# Patient Record
Sex: Male | Born: 1960
Health system: Southern US, Community
[De-identification: ages and names within clinical notes are randomized; demographics above are authoritative.]

## PROBLEM LIST (undated history)

## (undated) DIAGNOSIS — I10 Essential (primary) hypertension: Secondary | ICD-10-CM

## (undated) DIAGNOSIS — K219 Gastro-esophageal reflux disease without esophagitis: Secondary | ICD-10-CM

## (undated) DIAGNOSIS — N2889 Other specified disorders of kidney and ureter: Secondary | ICD-10-CM

## (undated) DIAGNOSIS — F419 Anxiety disorder, unspecified: Secondary | ICD-10-CM

## (undated) DIAGNOSIS — F32A Depression, unspecified: Secondary | ICD-10-CM

## (undated) DIAGNOSIS — E119 Type 2 diabetes mellitus without complications: Secondary | ICD-10-CM

## (undated) DIAGNOSIS — R768 Other specified abnormal immunological findings in serum: Secondary | ICD-10-CM

## (undated) DIAGNOSIS — M542 Cervicalgia: Secondary | ICD-10-CM

## (undated) DIAGNOSIS — E1142 Type 2 diabetes mellitus with diabetic polyneuropathy: Secondary | ICD-10-CM

## (undated) DIAGNOSIS — L409 Psoriasis, unspecified: Secondary | ICD-10-CM

## (undated) DIAGNOSIS — M1990 Unspecified osteoarthritis, unspecified site: Secondary | ICD-10-CM

## (undated) DIAGNOSIS — E785 Hyperlipidemia, unspecified: Secondary | ICD-10-CM

## (undated) DIAGNOSIS — K760 Fatty (change of) liver, not elsewhere classified: Secondary | ICD-10-CM

## (undated) HISTORY — DX: Gastro-esophageal reflux disease without esophagitis: K21.9

## (undated) HISTORY — DX: Cervicalgia: M54.2

## (undated) HISTORY — PX: EXTERNAL EAR SURGERY: SHX627

## (undated) HISTORY — DX: Type 2 diabetes mellitus without complications: E11.9

## (undated) HISTORY — PX: OTHER SURGICAL HISTORY: SHX169

## (undated) HISTORY — PX: BACK SURGERY: SHX140

---

## 2005-11-20 ENCOUNTER — Ambulatory Visit: Payer: Self-pay | Admitting: Gastroenterology

## 2005-12-13 ENCOUNTER — Ambulatory Visit (HOSPITAL_COMMUNITY): Admission: RE | Admit: 2005-12-13 | Discharge: 2005-12-13 | Payer: Self-pay | Admitting: Gastroenterology

## 2012-05-18 ENCOUNTER — Emergency Department (HOSPITAL_COMMUNITY)
Admission: EM | Admit: 2012-05-18 | Discharge: 2012-05-18 | Disposition: A | Discharge status: Home / Self Care | Payer: Self-pay | Attending: Emergency Medicine | Admitting: Emergency Medicine

## 2012-05-18 DIAGNOSIS — S0190XA Unspecified open wound of unspecified part of head, initial encounter: Secondary | ICD-10-CM | POA: Insufficient documentation

## 2012-05-18 DIAGNOSIS — Y9229 Other specified public building as the place of occurrence of the external cause: Secondary | ICD-10-CM | POA: Insufficient documentation

## 2012-05-18 DIAGNOSIS — IMO0002 Reserved for concepts with insufficient information to code with codable children: Secondary | ICD-10-CM | POA: Insufficient documentation

## 2012-05-18 DIAGNOSIS — Z23 Encounter for immunization: Secondary | ICD-10-CM | POA: Insufficient documentation

## 2012-05-18 DIAGNOSIS — Y939 Activity, unspecified: Secondary | ICD-10-CM | POA: Insufficient documentation

## 2016-04-13 ENCOUNTER — Other Ambulatory Visit (HOSPITAL_BASED_OUTPATIENT_CLINIC_OR_DEPARTMENT_OTHER): Payer: Self-pay | Admitting: Physician Assistant

## 2016-04-13 DIAGNOSIS — R945 Abnormal results of liver function studies: Principal | ICD-10-CM

## 2016-04-13 DIAGNOSIS — R7989 Other specified abnormal findings of blood chemistry: Secondary | ICD-10-CM

## 2016-04-21 ENCOUNTER — Ambulatory Visit (HOSPITAL_BASED_OUTPATIENT_CLINIC_OR_DEPARTMENT_OTHER)
Admission: RE | Admit: 2016-04-21 | Discharge: 2016-04-21 | Disposition: A | Discharge status: Home / Self Care | Payer: Medicaid Other | Source: Ambulatory Visit | Attending: Physician Assistant | Admitting: Physician Assistant

## 2016-04-21 DIAGNOSIS — K760 Fatty (change of) liver, not elsewhere classified: Secondary | ICD-10-CM | POA: Diagnosis not present

## 2016-04-21 DIAGNOSIS — R161 Splenomegaly, not elsewhere classified: Secondary | ICD-10-CM | POA: Insufficient documentation

## 2016-04-21 DIAGNOSIS — R7989 Other specified abnormal findings of blood chemistry: Secondary | ICD-10-CM

## 2016-04-21 DIAGNOSIS — R945 Abnormal results of liver function studies: Secondary | ICD-10-CM

## 2016-10-24 ENCOUNTER — Encounter: Payer: Self-pay | Admitting: Internal Medicine

## 2016-10-24 ENCOUNTER — Ambulatory Visit (INDEPENDENT_AMBULATORY_CARE_PROVIDER_SITE_OTHER): Payer: Medicaid Other | Admitting: Internal Medicine

## 2016-10-24 ENCOUNTER — Telehealth: Payer: Self-pay | Admitting: *Deleted

## 2016-10-24 DIAGNOSIS — B182 Chronic viral hepatitis C: Secondary | ICD-10-CM

## 2016-10-24 MED ORDER — GLECAPREVIR-PIBRENTASVIR 100-40 MG PO TABS: 3.0000 | ORAL_TABLET | Freq: Every day | ORAL | 1 refills | Status: DC

## 2016-10-24 NOTE — Patient Instructions (Signed)
Date 10/24/16  Dear Mr Gullickson, As discussed in the ID Clinic, your hepatitis C therapy will include the following medications:          Mavyret (glecaprevir 100 mg/pibrentasvir 40 mg): Take 3 tablets by mouth once daily for 8 or 12 weeks ---------------------------------------------------------------- Your HCV Treatment Start Date: TBA   Your HCV genotype:  1a    Liver Fibrosis: TBD    ---------------------------------------------------------------- YOUR PHARMACY CONTACT:   [facility] [address] [place], [place] [address] Phone: [phone] Hours: Monday to Friday 7:30 am to 6:00 pm   Please always contact your pharmacy at least 3-4 business days before you run out of medications to ensure your next month's medication is ready or 1 week prior to running out if you receive it by mail.  Remember, each prescription is for 28 days. ---------------------------------------------------------------- GENERAL NOTES REGARDING YOUR HEPATITIS C MEDICATION:  Mavyret: - tablets are pink, oblong shape - take 3 tablets daily with food. - The tablets should be stored at room temperature.  - Acid reducing agents such as H2 blockers (ie. Pepcid (famotidine), Zantac (ranitidine), Tagamet (cimetidine), Axid (nizatidine) and proton pump inhibitors (ie. Prilosec (omeprazole), Protonix (pantoprazole), Nexium (esomeprazole), or Aciphex (rabeprazole)) can decrease effectiveness of Harvoni. Do not take until you have discussed with a health care provider.    -Antacids that contain magnesium and/or aluminum hydroxide (ie. Milk of Magensia, Rolaids, Gaviscon, Maalox, Mylanta, an dArthritis Pain Formula) can reduce absorption of Harvoni, so take them at least 4 hours before or after Harvoni.  -Calcium carbonate (calcium supplements or antacids such as Tums, Caltrate, Os-Cal) needs to be taken at least 4 hours hours before or after Harvoni.  -St. John's wort or any products that contain  St. John's wort like some herbal supplements  Please inform the office prior to starting any of these medications.  - The common side effects associated with Mavyret include:      1. Fatigue      2. Headache      3. Nausea      4. Diarrhea      5. Insomnia  Please note that this only lists the most common side effects and is NOT a comprehensive list of the potential side effects of these medications. For more information, please review the drug information sheets that come with your medication package from the pharmacy.  ---------------------------------------------------------------- GENERAL HELPFUL HINTS ON HCV THERAPY: 1. Stay well-hydrated. 2. Notify the ID Clinic of any changes in your other over-the-counter/herbal or prescription medications. 3. If you miss a dose of your medication, take the missed dose as soon as you remember. Return to your regular time/dose schedule the next day.  4.  Do not stop taking your medications without first talking with your healthcare provider. 5.  You may take Tylenol (acetaminophen), as long as the dose is less than 2000 mg (OR no more than 4 tablets of the Tylenol Extra Strengths  tablet) in 24 hours. 6.  You will see our pharmacist-specialist within the first 2 weeks of starting your medication to monitor for any possible side effects. 7.  You will have labs once during treatment, after soon after treatment completion and one final lab 6 months after treatment completion to verify the virus is out of your system.  Staci Righter, MD  [facility] [address] [place] [address] [place], [place]  [address] [phone]

## 2016-10-24 NOTE — Progress Notes (Signed)
[facility]   CC: consideration for treatment for chronic hepatitis C  HPI:  +Frank Randolph is a 56 y.o. male who presents for initial evaluation and management of chronic hepatitis C.  Patient tested positive over 10 years ago. Hepatitis C-associated risk factors present are: tattoos. Patient denies history of blood transfusion, sexual contact with person with liver disease. Patient has had other studies performed. Results: hepatitis C RNA by PCR, result: positive. Patient has not had prior treatment for Hepatitis C. Patient does not have a past history of liver disease. Patient does not have a family history of liver disease. Patient does not  have associated signs or symptoms related to liver disease.  Labs reviewed and confirm chronic hepatitis C with a positive viral load.  He previously had a biopsy by his report but told he did not require treatment at the time.   Labs reviewed and genoytpe 1a.      Patient does have documented immunity to Hepatitis A. Patient does have documented immunity to Hepatitis B.    Review of Systems:   Constitutional: negative for fatigue, malaise and anorexia Gastrointestinal: negative for diarrhea Integument/breast: negative for rash All other systems reviewed and are negative       No past medical history on file.  Prior to Admission medications   Medication Sig Start Date End Date Taking? Authorizing Provider  ACCU-CHEK AVIVA PLUS test strip USE TO TEST BLOOD SUGAR THREE TIMES DAILY AS DIRECTED 10/01/16  Yes Historical Provider, MD  glimepiride (AMARYL) 4 MG tablet Take 4 mg by mouth 2 (two) times daily. 10/01/16  Yes Historical Provider, MD  GLOBAL INSULIN SYRINGES 30G X 5/16" 0.3 ML MISC USE TO INJECT INSULIN TWICE DAILY (50 DAY SUPPLY) DO NOT SWITCH PATIENT WANTS THIS SIZE ONLY 10/01/16  Yes Historical Provider, MD  HUMULIN 70/30 (70-30) 100 UNIT/ML injection INJECT 20 UNITS SUB-Q TWICE DAILY BEFORE MEALS 10/01/16  Yes  Historical Provider, MD  metFORMIN (GLUCOPHAGE-XR) 500 MG 24 hr tablet Take 1,000 mg by mouth 2 (two) times daily. 10/01/16  Yes Historical Provider, MD  triamcinolone cream (KENALOG) 0.1 % APPLY TO AFFECTED AREA TWICE DAILY AS NEEDED 08/03/16  Yes Historical Provider, MD  Glecaprevir-Pibrentasvir (MAVYRET) 100-40 MG TABS Take 3 tablets by mouth daily. 10/24/16   Gardiner Barefoot, MD    No Known Allergies  Social History  Substance Use Topics  . Smoking status: Former Smoker    Types: Cigarettes  . Smokeless tobacco: Never Used  . Alcohol use 3.6 oz/week    6 Cans of beer per week     Comment: weekends    FMH: no cirrhosis, no liver cancer   Objective:  Constitutional: in no apparent distress and alert,  Vitals:   10/24/16 0925  BP: (!) 169/103  Pulse: (!) 101  Temp: 97.8 F (36.6 C)   Eyes: anicteric Cardiovascular: Cor RRR Respiratory: CTA B;' normal respiratory effort Gastrointestinal: Bowel sounds are normal, liver is not enlarged, soft, nt Musculoskeletal: no pedal edema noted Skin: negatives: no rash; no porphyria cutanea tarda Lymphatic: no cervical lymphadenopathy   Laboratory  Labs and history reviewed and show CHILD-PUGH A  5-6 points: Child class A 7-9 points: Child class B 10-15 points: Child class C   Assessment: New Patient with Chronic Hepatitis C genotype 1a, untreated.  I discussed with the patient the lab findings that confirm chronic hepatitis C as well as the natural history and progression of disease including about 30% of  people who develop cirrhosis of the liver if left untreated and once cirrhosis is established there is a 2-7% risk per year of liver cancer and liver failure.  I discussed the importance of treatment and benefits in reducing the risk, even if significant liver fibrosis exists.   Plan: 1) Patient counseled extensively on limiting acetaminophen to no more than 2 grams daily, avoidance of alcohol. 2) Transmission discussed with  patient including sexual transmission, sharing razors and toothbrush.   3) Will need referral to gastroenterology if concern for cirrhosis 4) Will need referral for substance abuse counseling: No.; Further work up to include urine drug screen  No. 5) Will prescribe Mayvret for 8 weeks 12 weeks, depending on Fibrosure  6) Hepatitis A vaccine No. 7) Hepatitis B vaccine No. 8) Pneumovax vaccine  9) Further work up to include liver staging with elastography 10) will follow up after starting medication 11) readiness form signed

## 2016-10-24 NOTE — Telephone Encounter (Signed)
Prior authorization initiated for ultrasound elastography, ref # [id number]. It is now in review and will be notified via fax in 1-2 business days. Patient aware

## 2016-10-27 LAB — LIVER FIBROSIS, FIBROTEST-ACTITEST
ALPHA-2-MACROGLOBULIN: 537 mg/dL — AB (ref 106–279)
ALT: 239 U/L — AB (ref 9–46)
APOLIPOPROTEIN A1: 122 mg/dL (ref 94–176)
BILIRUBIN: 0.9 mg/dL (ref 0.2–1.2)
FIBROSIS SCORE: 0.96
GGT: 147 U/L — AB (ref 3–85)
Haptoglobin: 32 mg/dL — ABNORMAL LOW (ref 43–212)
Necroinflammat ACT Score: 0.94
Reference ID: 1902687

## 2016-10-29 NOTE — Telephone Encounter (Signed)
Medicaid has denied the ultrasound elastography. They will only approve an abdominal ultrasound limited (CPT [phone]) at this time. Dr. Luciana Axe is aware and patient has been notified.

## 2016-10-31 ENCOUNTER — Other Ambulatory Visit: Payer: Self-pay | Admitting: Pharmacist

## 2016-10-31 DIAGNOSIS — B182 Chronic viral hepatitis C: Secondary | ICD-10-CM

## 2016-10-31 MED ORDER — GLECAPREVIR-PIBRENTASVIR 100-40 MG PO TABS: 3.0000 | ORAL_TABLET | Freq: Every day | ORAL | 2 refills | Status: DC

## 2016-11-02 MED FILL — MAVYRET 100-40 MG TABS: 100-40 | 28 days supply | Qty: 84 | Fill #0

## 2016-11-05 ENCOUNTER — Other Ambulatory Visit: Payer: Self-pay | Admitting: Internal Medicine

## 2016-11-05 DIAGNOSIS — B182 Chronic viral hepatitis C: Secondary | ICD-10-CM

## 2016-11-06 ENCOUNTER — Telehealth: Payer: Self-pay | Admitting: *Deleted

## 2016-11-06 NOTE — Telephone Encounter (Signed)
Patient notified that his Hep C medication is ready for pick up at [facility] outpatient pharmacy. He can also have it delivered if he prefers. Patient said he would pick it up tomorrow.

## 2016-11-16 ENCOUNTER — Encounter: Payer: Self-pay | Admitting: Pharmacy Technician

## 2016-12-12 MED FILL — MAVYRET 100-40 MG TABS: 100-40 | 28 days supply | Qty: 84 | Fill #1

## 2016-12-20 ENCOUNTER — Ambulatory Visit (INDEPENDENT_AMBULATORY_CARE_PROVIDER_SITE_OTHER): Payer: Medicaid Other | Admitting: Pharmacist

## 2016-12-20 DIAGNOSIS — B182 Chronic viral hepatitis C: Secondary | ICD-10-CM | POA: Diagnosis not present

## 2016-12-20 NOTE — Progress Notes (Signed)
HPI: Frank Randolph is a 56 y.o. male who presents to the [facility] pharmacy clinic today for Hep C follow-up. He has genotype 1a, F4 fibrosis, and started 12 weeks of Mavyret ~11/09/16.   No results found for: HCVGENOTYPE, HEPCGENOTYPE  Allergies: No Known Allergies  Past Medical History: No past medical history on file.  Social History: Social History   Social History  . Marital status: Single    Spouse name: N/A  . Number of children: N/A  . Years of education: N/A   Social History Main Topics  . Smoking status: Former Smoker    Types: Cigarettes  . Smokeless tobacco: Never Used  . Alcohol use 3.6 oz/week    6 Cans of beer per week     Comment: weekends  . Drug use: No  . Sexual activity: Yes    Partners: Female   Other Topics Concern  . Not on file   Social History Narrative  . No narrative on file    Labs: No results found for: HIV1RNAQUANT, HIV1RNAVL, CD4TABS, HEPBSAB, HEPBSAG, HCVAB  No results found for: HCVGENOTYPE, HEPCGENOTYPE  No flowsheet data found.  ALT (U/L)  Date Value  10/24/2016 239 (H)    CrCl: CrCl cannot be calculated (No order found.).  Fibrosis Score: F4 as assessed by fibrosure   Child-Pugh Score: A  Previous Treatment Regimen: None  Assessment: Frank Randolph is here today for Hep C follow-up.  [facility] and is on his 2nd month of medications. He is having diarrhea with the medication and states he has had it since the beginning.  He has not tried anything for it.  Gave him recommendations for generic Imodium OTC.  Told him to keep hydrated.  He also complains of a slight headache since starting the medication. He missed 2 days of his Mavyret when he ran out of it the first month and did not call for a refill.  We finally were able to reach him the day after he took his last pill the first month, so that is why he missed 2 doses.  I told him not to miss anymore doses from here on out. He takes all three pills together in the morning  with breakfast. We will get a Hep C VL today. I will make his EOT appt with Dr. Luciana Axeomer.    Plans: - Continue 12 weeks of Mavyret - F/u with Dr. Luciana Axeomer for EOT since he is F4 on 8/1 at 2pm  Frank Randolph, PharmD, CPP Infectious Diseases Clinical Pharmacist [facility] 12/20/2016, 1:39 PM

## 2016-12-20 NOTE — Patient Instructions (Signed)
You can pick up some generic Imodium for your diarrhea. Take 2 tablets right now, then 1 tablet after each loose stool. Take no more than 7 tablets/day.

## 2016-12-24 LAB — HEPATITIS C RNA QUANTITATIVE
HCV QUANT: NOT DETECTED [IU]/mL
HCV Quantitative Log: <1.18 Log IU/mL

## 2016-12-28 ENCOUNTER — Telehealth: Payer: Self-pay | Admitting: *Deleted

## 2016-12-28 NOTE — Telephone Encounter (Signed)
Patient called for results of his hep c viral load. Advised patient that it is currently undetected. He asked what that meant and I said that it does not show hepatitis c in his blood. He said should he continue to take the medication and I advised that he should continue to take it as directed. Wendall MolaJacqueline Cockerham

## 2016-12-31 ENCOUNTER — Other Ambulatory Visit: Payer: Self-pay | Admitting: Pharmacist

## 2016-12-31 DIAGNOSIS — B182 Chronic viral hepatitis C: Secondary | ICD-10-CM

## 2016-12-31 MED ORDER — GLECAPREVIR-PIBRENTASVIR 100-40 MG PO TABS: 3.0000 | ORAL_TABLET | Freq: Every day | ORAL | 0 refills | Status: DC

## 2017-01-03 MED FILL — MAVYRET 100-40 MG TABS: 100-40 | 28 days supply | Qty: 84 | Fill #0

## 2017-02-13 ENCOUNTER — Encounter: Payer: Self-pay | Admitting: Internal Medicine

## 2017-02-13 ENCOUNTER — Ambulatory Visit (INDEPENDENT_AMBULATORY_CARE_PROVIDER_SITE_OTHER): Payer: Medicaid Other | Admitting: Internal Medicine

## 2017-02-13 VITALS — BP 164/100 | HR 92 | Temp 97.4°F | Ht 67.0 in | Wt 178.0 lb

## 2017-02-13 DIAGNOSIS — B182 Chronic viral hepatitis C: Secondary | ICD-10-CM | POA: Diagnosis not present

## 2017-02-13 DIAGNOSIS — K7400 Hepatic fibrosis, unspecified: Secondary | ICD-10-CM

## 2017-02-13 DIAGNOSIS — K7460 Unspecified cirrhosis of liver: Secondary | ICD-10-CM | POA: Insufficient documentation

## 2017-02-13 NOTE — Assessment & Plan Note (Signed)
I will check EOT lab today.  rtc 3-4 months for SVR 12

## 2017-02-13 NOTE — Assessment & Plan Note (Signed)
I will resubmit for the elastography to confirm above as this would indicated wether HCC screening and EGD is indicated

## 2017-02-13 NOTE — Progress Notes (Signed)
   Subjective:    Patient ID: Frank ArnoldEarl Randolph, male    DOB: June 07, 1961, 56 y.o.   MRN: [id number]  HPI Here for follow up of HCV He completed 12 weeks of Mavyret and here for EOT lab.  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  He had no issues during treatment.  No associated headache, fatigue.  No rashes.     Review of Systems  Constitutional: Negative for fatigue.  Gastrointestinal: Negative for diarrhea.  Skin: Negative for rash.       Objective:   Physical Exam  Constitutional: He appears well-developed and well-nourished. No distress.  Eyes: No scleral icterus.  Cardiovascular: Normal rate, regular rhythm and normal heart sounds.   No murmur heard. Lymphadenopathy:    He has no cervical adenopathy.  Skin: No rash noted.   SH: occasional alcohol       Assessment & Plan:

## 2017-02-14 LAB — COMPLETE METABOLIC PANEL WITH GFR
ALT: 103 U/L — AB (ref 9–46)
AST: 55 U/L — AB (ref 10–35)
Albumin: 4.6 g/dL (ref 3.6–5.1)
Alkaline Phosphatase: 58 U/L (ref 40–115)
BUN: 8 mg/dL (ref 7–25)
CALCIUM: 9.4 mg/dL (ref 8.6–10.3)
CHLORIDE: 102 mmol/L (ref 98–110)
CO2: 21 mmol/L (ref 20–31)
CREATININE: 0.67 mg/dL — AB (ref 0.70–1.33)
GFR, Est African American: >89 mL/min (ref >=60)
GFR, Est Non African American: >89 mL/min (ref >=60)
GLUCOSE: 77 mg/dL (ref 65–99)
Potassium: 4.1 mmol/L (ref 3.5–5.3)
Sodium: 139 mmol/L (ref 135–146)
Total Bilirubin: 1 mg/dL (ref 0.2–1.2)
Total Protein: 7.6 g/dL (ref 6.1–8.1)

## 2017-02-16 LAB — HEPATITIS C RNA QUANTITATIVE
HCV QUANT: NOT DETECTED [IU]/mL
HCV Quantitative Log: <1.18 Log IU/mL

## 2017-06-13 ENCOUNTER — Ambulatory Visit: Payer: Medicaid Other | Admitting: Internal Medicine

## 2017-06-13 ENCOUNTER — Emergency Department (HOSPITAL_BASED_OUTPATIENT_CLINIC_OR_DEPARTMENT_OTHER): Payer: Medicaid Other

## 2017-06-13 ENCOUNTER — Other Ambulatory Visit: Payer: Self-pay

## 2017-06-13 ENCOUNTER — Encounter: Payer: Self-pay | Admitting: Internal Medicine

## 2017-06-13 ENCOUNTER — Encounter (HOSPITAL_BASED_OUTPATIENT_CLINIC_OR_DEPARTMENT_OTHER): Payer: Self-pay | Admitting: *Deleted

## 2017-06-13 ENCOUNTER — Emergency Department (HOSPITAL_BASED_OUTPATIENT_CLINIC_OR_DEPARTMENT_OTHER)
Admission: EM | Admit: 2017-06-13 | Discharge: 2017-06-13 | Disposition: A | Discharge status: Home / Self Care | Payer: Medicaid Other | Attending: Emergency Medicine | Admitting: Emergency Medicine

## 2017-06-13 VITALS — BP 198/116 | HR 90 | Temp 97.7°F | Wt 182.0 lb

## 2017-06-13 DIAGNOSIS — Z7984 Long term (current) use of oral hypoglycemic drugs: Secondary | ICD-10-CM | POA: Diagnosis not present

## 2017-06-13 DIAGNOSIS — Z87891 Personal history of nicotine dependence: Secondary | ICD-10-CM | POA: Diagnosis not present

## 2017-06-13 DIAGNOSIS — K7400 Hepatic fibrosis, unspecified: Secondary | ICD-10-CM

## 2017-06-13 DIAGNOSIS — I10 Essential (primary) hypertension: Secondary | ICD-10-CM

## 2017-06-13 DIAGNOSIS — B182 Chronic viral hepatitis C: Secondary | ICD-10-CM | POA: Diagnosis present

## 2017-06-13 DIAGNOSIS — R51 Headache: Secondary | ICD-10-CM | POA: Diagnosis present

## 2017-06-13 DIAGNOSIS — Z79899 Other long term (current) drug therapy: Secondary | ICD-10-CM | POA: Insufficient documentation

## 2017-06-13 DIAGNOSIS — J0130 Acute sphenoidal sinusitis, unspecified: Secondary | ICD-10-CM | POA: Insufficient documentation

## 2017-06-13 HISTORY — DX: Essential (primary) hypertension: I10

## 2017-06-13 LAB — COMPLETE METABOLIC PANEL WITH GFR
AG RATIO: 1.7 (calc) (ref 1.0–2.5)
ALBUMIN MSPROF: 4.7 g/dL (ref 3.6–5.1)
ALT: 81 U/L — ABNORMAL HIGH (ref 9–46)
AST: 54 U/L — ABNORMAL HIGH (ref 10–35)
Alkaline phosphatase (APISO): 49 U/L (ref 40–115)
BILIRUBIN TOTAL: 0.8 mg/dL (ref 0.2–1.2)
BUN: 14 mg/dL (ref 7–25)
CHLORIDE: 104 mmol/L (ref 98–110)
CO2: 26 mmol/L (ref 20–32)
Calcium: 9.7 mg/dL (ref 8.6–10.3)
Creat: 0.76 mg/dL (ref 0.70–1.33)
GFR, EST AFRICAN AMERICAN: 119 mL/min/{1.73_m2} (ref >=60)
GFR, Est Non African American: 103 mL/min/{1.73_m2} (ref >=60)
Globulin: 2.7 g/dL (calc) (ref 1.9–3.7)
Glucose, Bld: 106 mg/dL — ABNORMAL HIGH (ref 65–99)
POTASSIUM: 4.4 mmol/L (ref 3.5–5.3)
Sodium: 140 mmol/L (ref 135–146)
TOTAL PROTEIN: 7.4 g/dL (ref 6.1–8.1)

## 2017-06-13 LAB — BASIC METABOLIC PANEL
Anion gap: 7 (ref 5–15)
BUN: 15 mg/dL (ref 6–20)
CALCIUM: 9.3 mg/dL (ref 8.9–10.3)
CHLORIDE: 105 mmol/L (ref 101–111)
CO2: 25 mmol/L (ref 22–32)
CREATININE: 0.75 mg/dL (ref 0.61–1.24)
Glucose, Bld: 120 mg/dL — ABNORMAL HIGH (ref 65–99)
Potassium: 3.8 mmol/L (ref 3.5–5.1)
SODIUM: 137 mmol/L (ref 135–145)

## 2017-06-13 LAB — CBC WITH DIFFERENTIAL/PLATELET
BASOS ABS: 88 {cells}/uL (ref 0–200)
BASOS PCT: 1 %
Basophils Absolute: 0.1 10*3/uL (ref 0.0–0.1)
Basophils Relative: 1.1 %
EOS ABS: 0.5 10*3/uL (ref 0.0–0.7)
EOS PCT: 6 %
EOS PCT: 6 %
Eosinophils Absolute: 480 cells/uL (ref 15–500)
HCT: 41.1 % (ref 39.0–52.0)
HEMATOCRIT: 43.3 % (ref 38.5–50.0)
HEMOGLOBIN: 14.6 g/dL (ref 13.0–17.0)
Hemoglobin: 15.3 g/dL (ref 13.2–17.1)
Lymphocytes Relative: 46 %
Lymphs Abs: 3088 cells/uL (ref 850–3900)
Lymphs Abs: 4.4 10*3/uL — ABNORMAL HIGH (ref 0.7–4.0)
MCH: 30.3 pg (ref 27.0–33.0)
MCH: 30.5 pg (ref 26.0–34.0)
MCHC: 35.3 g/dL (ref 32.0–36.0)
MCHC: 35.5 g/dL (ref 30.0–36.0)
MCV: 85.7 fL (ref 80.0–100.0)
MCV: 85.8 fL (ref 78.0–100.0)
MONOS PCT: 7.4 %
MONOS PCT: 8 %
MPV: 10.2 fL (ref 7.5–12.5)
Monocytes Absolute: 0.8 10*3/uL (ref 0.1–1.0)
NEUTROS PCT: 46.9 %
NEUTROS PCT: 39 %
Neutro Abs: 3752 cells/uL (ref 1500–7800)
Neutro Abs: 3.6 10*3/uL (ref 1.7–7.7)
PLATELETS: 182 10*3/uL (ref 150–400)
Platelets: 179 10*3/uL (ref 140–400)
RBC: 5.05 10*6/uL (ref 4.20–5.80)
RBC: 4.79 MIL/uL (ref 4.22–5.81)
RDW: 12.6 % (ref 11.0–15.0)
RDW: 13 % (ref 11.5–15.5)
Total Lymphocyte: 38.6 %
WBC mixed population: 592 cells/uL (ref 200–950)
WBC: 8 10*3/uL (ref 3.8–10.8)
WBC: 9.4 10*3/uL (ref 4.0–10.5)

## 2017-06-13 LAB — TROPONIN I

## 2017-06-13 MED ORDER — AMOXICILLIN-POT CLAVULANATE 875-125 MG PO TABS
1.0000 | ORAL_TABLET | Freq: Two times a day (BID) | ORAL | 0 refills | Status: AC
Start: 2017-06-13 — End: 2017-06-23

## 2017-06-13 MED ORDER — HYDRALAZINE HCL 20 MG/ML IJ SOLN
10.0000 mg | Freq: Once | INTRAMUSCULAR | Status: AC
  Administered 2017-06-13: 10 mg via INTRAVENOUS
  Filled 2017-06-13: qty 1

## 2017-06-13 MED ORDER — CLONIDINE HCL 0.1 MG PO TABS: 0.1000 mg | ORAL_TABLET | Freq: Once | ORAL | Status: DC

## 2017-06-13 MED ORDER — LISINOPRIL 10 MG PO TABS
10.0000 mg | ORAL_TABLET | Freq: Once | ORAL | Status: AC
  Administered 2017-06-13: 10 mg via ORAL
  Filled 2017-06-13: qty 1

## 2017-06-13 MED ORDER — CLONIDINE HCL 0.1 MG PO TABS
0.2000 mg | ORAL_TABLET | Freq: Once | ORAL | Status: AC
  Administered 2017-06-13: 0.2 mg via ORAL
  Filled 2017-06-13: qty 2

## 2017-06-13 MED ORDER — AMOXICILLIN-POT CLAVULANATE 875-125 MG PO TABS
1.0000 | ORAL_TABLET | Freq: Once | ORAL | Status: AC
  Administered 2017-06-13: 1 via ORAL
  Filled 2017-06-13: qty 1

## 2017-06-13 MED ORDER — CLONIDINE HCL 0.1 MG PO TABS
0.1000 mg | ORAL_TABLET | Freq: Once | ORAL | Status: AC
  Administered 2017-06-13: 0.1 mg via ORAL
  Filled 2017-06-13: qty 1

## 2017-06-13 MED ORDER — LISINOPRIL 10 MG PO TABS: 10.0000 mg | ORAL_TABLET | Freq: Every day | ORAL | 0 refills | Status: DC

## 2017-06-13 MED ORDER — CLONIDINE HCL 0.1 MG PO TABS: 0.2000 mg | ORAL_TABLET | Freq: Once | ORAL | Status: DC

## 2017-06-13 NOTE — Discharge Instructions (Signed)
It is very important to take your medications as prescribed.  I think that your blood pressure is elevated due to stress, as well as pain form your sinus infection.  Take the antibiotic as prescribed.  For your blood pressure, it is important to remember that stress and anxiety can cause it to be very elevated. I think it's OKAY to keep taking your propranolol and I have prescribed an additional medication for the short term to help with your pressure. If you notice that your blood pressure is dropping LOW (<120/90), stop taking the lisinopril.  Call your doctor TOMORROW MORNING to discuss your Er visit and the recommended medication changes.  When checking your blood pressure, - If your blood pressure is >180/100, go to a quiet, calm room to relax. Sit for 15-20 minutes then re-check your blood pressure, in both arms. - If your pressure remains higher than >180/100 but you have NO OTHER SYMPTOMS, re-check it in 30 minutes - If your pressure is >180/100 and you have headache, chest pain, or other symptoms like vision changes, go to the ER - If your pressure is >220/120, present to the ER

## 2017-06-13 NOTE — ED Triage Notes (Signed)
Headache all day. He was seen by his MD this am for a routine check up. His BP was high in the office. He was given a Rx yesterday by his primary MD for HTN while in the office being seen for hip pain. He took the new medication x 2 today.

## 2017-06-13 NOTE — Assessment & Plan Note (Signed)
Will try again to get elastography and [facility] if indicated on [facility].  [facility] results.  Will also check cbc and consider GI referral if F4 next visit.

## 2017-06-13 NOTE — ED Provider Notes (Signed)
[facility] EMERGENCY DEPARTMENT Provider Note   CSN: [id number] Arrival date & time: 06/13/17  1828     History   Chief Complaint Chief Complaint  Patient presents with  . Headache    HPI Frank Randolph is a 56 y.o. male.  The history is provided by the patient.   56 year old male with history of hypertension, hepatitis C, who presents with headache.  The patient states that over the last several days, he is noticed a dull, aching, frontal headache that is worse when leaning forward.  He checked his blood pressure today and it was noted to be significantly elevated.  He was also seen in clinic and it was in the 180s over 110s.  He states that he normally runs in the 130s over 80s-90s.  He states that he has had associated mild dizziness and sensation of "being off."  Denies any focal numbness or weakness.  No chest pain, shortness of breath, or flank pain.  Denies any vision changes.  He has a history of hypertension and states he has been taking his medications as prescribed.  He does note that he has had mild nasal congestion and rhinorrhea over the last several days.  Denies any other medical complaints.  No fevers or chills.  No neck pain or stiffness.  Past Medical History:  Diagnosis Date  . Hypertension     Patient Active Problem List   Diagnosis Date Noted  . HTN (hypertension) 06/13/2017  . Liver fibrosis 02/13/2017  . Chronic hepatitis C without hepatic coma (HCC) 10/24/2016    Past Surgical History:  Procedure Laterality Date  . BACK SURGERY         Home Medications    Prior to Admission medications   Medication Sig Start Date End Date Taking? Authorizing Provider  ACCU-CHEK AVIVA PLUS test strip USE TO TEST BLOOD SUGAR THREE TIMES DAILY AS DIRECTED 10/01/16   [provider]  amoxicillin-clavulanate (AUGMENTIN) 875-125 MG tablet Take 1 tablet by mouth 2 (two) times daily for 10 days. 06/13/17 06/23/17  Shaune Pollack, MD  glimepiride  (AMARYL) 4 MG tablet Take 4 mg by mouth 2 (two) times daily. 10/01/16   [provider]  GLOBAL INSULIN SYRINGES 30G X 5/16" 0.3 ML MISC USE TO INJECT INSULIN TWICE DAILY (50 DAY SUPPLY) DO NOT SWITCH PATIENT WANTS THIS SIZE ONLY 10/01/16   [provider]  HUMULIN 70/30 (70-30) 100 UNIT/ML injection INJECT 20 UNITS SUB-Q TWICE DAILY BEFORE MEALS 10/01/16   [provider]  lisinopril (PRINIVIL,ZESTRIL) 10 MG tablet Take 1 tablet (10 mg total) by mouth daily. 06/13/17   Shaune Pollack, MD  metFORMIN (GLUCOPHAGE-XR) 500 MG 24 hr tablet Take 1,000 mg by mouth 2 (two) times daily. 10/01/16   [provider]  triamcinolone cream (KENALOG) 0.1 % APPLY TO AFFECTED AREA TWICE DAILY AS NEEDED 08/03/16   [provider]    Family History No family history on file.  Social History Social History   Tobacco Use  . Smoking status: Former Smoker    Types: Cigarettes  . Smokeless tobacco: Never Used  Substance Use Topics  . Alcohol use: Yes    Alcohol/week: 3.6 oz    Types: 6 Cans of beer per week    Comment: weekends  . Drug use: No     Allergies   Patient has no known allergies.   Review of Systems Review of Systems  Constitutional: Positive for fatigue. Negative for chills and fever.  HENT: Positive for  congestion and sinus pressure. Negative for rhinorrhea.   Eyes: Negative for visual disturbance.  Respiratory: Negative for cough, shortness of breath and wheezing.   Cardiovascular: Negative for chest pain and leg swelling.  Gastrointestinal: Negative for abdominal pain, diarrhea, nausea and vomiting.  Genitourinary: Negative for dysuria and flank pain.  Musculoskeletal: Negative for neck pain and neck stiffness.  Skin: Negative for rash and wound.  Allergic/Immunologic: Negative for immunocompromised state.  Neurological: Positive for light-headedness and headaches. Negative for syncope and weakness.  All other systems reviewed and are  negative.    Physical Exam Updated Vital Signs BP 132/89   Pulse 78   Temp 97.9 F (36.6 C) (Oral)   Resp 15   Ht 5\' 7"  (1.702 m)   Wt 83.9 kg (185 lb)   SpO2 95%   BMI 28.98 kg/m   Physical Exam  Constitutional: He is oriented to person, place, and time. He appears well-developed and well-nourished. No distress.  HENT:  Head: Normocephalic and atraumatic.  Eyes: Conjunctivae and EOM are normal. Pupils are equal, round, and reactive to light.  Neck: Normal range of motion. Neck supple.  Cardiovascular: Normal rate, regular rhythm and normal heart sounds. Exam reveals no friction rub.  No murmur heard. Pulmonary/Chest: Effort normal and breath sounds normal. No respiratory distress. He has no wheezes. He has no rales.  Abdominal: He exhibits no distension.  Musculoskeletal: He exhibits no edema.  Neurological: He is alert and oriented to person, place, and time. He exhibits normal muscle tone.  Skin: Skin is warm. Capillary refill takes less than 2 seconds.  Psychiatric: He has a normal mood and affect.  Nursing note and vitals reviewed.   Neurological Exam:  Mental Status: Alert and oriented to person, place, and time. Attention and concentration normal. Speech clear. Recent memory is intact. Cranial Nerves: Visual fields grossly intact. EOMI and PERRLA. No nystagmus noted. Facial sensation intact at forehead, maxillary cheek, and chin/mandible bilaterally. No facial asymmetry or weakness. Hearing grossly normal. Uvula is midline, and palate elevates symmetrically. Normal SCM and trapezius strength. Tongue midline without fasciculations. Motor: Muscle strength 5/5 in proximal and distal UE and LE bilaterally. No pronator drift. Muscle tone normal. Reflexes: 2+ and symmetrical in all four extremities.  Sensation: Intact to light touch in upper and lower extremities distally bilaterally.  Gait: Normal without ataxia. Coordination: Normal FTN bilaterally.    ED Treatments /  Results  Labs (all labs ordered are listed, but only abnormal results are displayed) Labs Reviewed  CBC WITH DIFFERENTIAL/PLATELET - Abnormal; Notable for the following components:      Result Value   Lymphs Abs 4.4 (*)    All other components within normal limits  BASIC METABOLIC PANEL - Abnormal; Notable for the following components:   Glucose, Bld 120 (*)    All other components within normal limits  TROPONIN I    EKG  EKG Interpretation None       Radiology Ct Head Wo Contrast  Result Date: 06/13/2017 CLINICAL DATA:  56 y/o  M; frontal headache and hypertension. EXAM: CT HEAD WITHOUT CONTRAST TECHNIQUE: Contiguous axial images were obtained from the base of the skull through the vertex without intravenous contrast. COMPARISON:  None. FINDINGS: Brain: No evidence of acute infarction, hemorrhage, hydrocephalus, extra-axial collection or mass lesion/mass effect. Nonspecific foci of hypoattenuation in subcortical and periventricular white matter likely representing moderate chronic microvascular ischemic changes for age. Small lucency in left medial lentiform nucleus/ Vascular: Calcific atherosclerosis of carotid siphons. No  hyperdense vessel. Skull: Normal. Negative for fracture or focal lesion. Sinuses/Orbits: Small fluid level in the left sphenoid sinus. Otherwise negative. Other: None. IMPRESSION: 1. No acute intracranial abnormality identified. 2. Moderate chronic microvascular ischemic changes and mild parenchymal volume loss for age. 3. Small lucency in left lentiform nucleus may represent chronic lacunar infarct or prominent perivascular space. 4. Small left sphenoid sinus fluid level may represent acute sinusitis in the appropriate clinical setting. Electronically Signed   By: Mitzi HansenLance  Furusawa-Stratton M.D.   On: 06/13/2017 20:19    Procedures Procedures (including critical care time)  Medications Ordered in ED Medications  lisinopril (PRINIVIL,ZESTRIL) tablet 10 mg (10 mg  Oral Given 06/13/17 1929)  cloNIDine (CATAPRES) tablet 0.2 mg (0.2 mg Oral Given 06/13/17 2006)  amoxicillin-clavulanate (AUGMENTIN) 875-125 MG per tablet 1 tablet (1 tablet Oral Given 06/13/17 2130)  cloNIDine (CATAPRES) tablet 0.1 mg (0.1 mg Oral Given 06/13/17 2129)  hydrALAZINE (APRESOLINE) injection 10 mg (10 mg Intravenous Given 06/13/17 2213)  lisinopril (PRINIVIL,ZESTRIL) tablet 10 mg (10 mg Oral Given 06/13/17 2213)     Initial Impression / Assessment and Plan / ED Course  I have reviewed the triage vital signs and the nursing notes.  Pertinent labs & imaging results that were available during my care of the patient were reviewed by me and considered in my medical decision making (see chart for details).     56 year old male with past medical history as above here with headache and hypertension.  Regarding his headache, it is unclear if this is primary due to his essential hypertension, although I suspect it is more so due to a sinusitis.  CT scan obtained and shows no bleed or acute abnormality, but does show any acute sphenoid sinusitis.  Will start him on Augmentin for this.  No evidence of concomitant meningitis or encephalitis.  No neck stiffness.  He has a normal white blood cell count.  Otherwise, other than his mild headache, no signs of hypertensive emergency.  His renal function is normal. EKG non-ischemic with negative troponin.  He did require multiple doses of p.o. as well as 1 dose of hydralazine in the ED for improvement in his blood pressure.  He now feels completely better with return to his baseline blood pressure.  I suspect this is due to his pain and sinusitis, but given the degree of his hypertension, will start him on lisinopril.  He will follow-up with his primary care physician this week for repeat lab work and checkup.  Patient is in agreement with this plan.  He has no history of allergies to lisinopril or personal or family history of angioedema.  Final Clinical  Impressions(s) / ED Diagnoses   Final diagnoses:  Acute non-recurrent sphenoidal sinusitis  Essential hypertension    ED Discharge Orders        Ordered    lisinopril (PRINIVIL,ZESTRIL) 10 MG tablet  Daily     06/13/17 2250    amoxicillin-clavulanate (AUGMENTIN) 875-125 MG tablet  2 times daily     06/13/17 2254       Shaune PollackIsaacs, Hiawatha Merriott, MD 06/13/17 2321

## 2017-06-13 NOTE — Assessment & Plan Note (Signed)
SVR 12 today to confirm cure.   

## 2017-06-13 NOTE — Assessment & Plan Note (Signed)
High and will go to pharmacy to get his medication.

## 2017-06-13 NOTE — ED Notes (Signed)
C/o ha and high bp x 2 days  bp meds were changed yesterday

## 2017-06-13 NOTE — Progress Notes (Signed)
   Subjective:    Patient ID: Frank Randolph, male    DOB: July 06, 1961, 56 y.o.   MRN: [id number]  HPI Here for follow up of hepatitis C Completed 12 weeks of Harvoni and did well.  EOT undetectable.  Has not been able to get elastography due to insurance denial.  Fibrosure F4.  Some transaminitis on labs previously.  No new issues.  BP up and has not yet started his bp meds.  No associated n/v.     Review of Systems  Constitutional: Negative for fatigue.  Gastrointestinal: Negative for diarrhea.  Skin: Negative for rash.  Neurological: Negative for dizziness.       Objective:   Physical Exam  Constitutional: He appears well-developed and well-nourished. No distress.  Eyes: No scleral icterus.  Cardiovascular: Normal rate, regular rhythm and normal heart sounds.  No murmur heard. Pulmonary/Chest: Effort normal and breath sounds normal. No respiratory distress.  Lymphadenopathy:    He has no cervical adenopathy.  Skin: No rash noted.   SH: no tobacco, no alcohol       Assessment & Plan:

## 2017-06-15 LAB — HEPATITIS C RNA QUANTITATIVE
HCV Quantitative Log: <1.18 Log IU/mL
HCV RNA, PCR, QN: NOT DETECTED [IU]/mL

## 2017-06-19 ENCOUNTER — Ambulatory Visit (HOSPITAL_COMMUNITY): Payer: Medicaid Other

## 2017-06-20 ENCOUNTER — Ambulatory Visit (HOSPITAL_COMMUNITY)
Admission: RE | Admit: 2017-06-20 | Discharge: 2017-06-20 | Disposition: A | Discharge status: Home / Self Care | Payer: Medicaid Other | Source: Ambulatory Visit | Attending: Internal Medicine | Admitting: Internal Medicine

## 2017-06-20 DIAGNOSIS — B182 Chronic viral hepatitis C: Secondary | ICD-10-CM | POA: Diagnosis present

## 2017-10-11 ENCOUNTER — Telehealth: Payer: Self-pay | Admitting: *Deleted

## 2017-10-11 NOTE — Telephone Encounter (Signed)
Referral fax from Hamilton Caprihao Le, DO [facility] sent to scheduling P# [phone]  F# [phone]

## 2017-10-17 ENCOUNTER — Ambulatory Visit: Payer: Medicaid Other | Admitting: Interventional Cardiology

## 2017-10-22 ENCOUNTER — Encounter: Payer: Self-pay | Admitting: Interventional Cardiology

## 2017-10-23 ENCOUNTER — Ambulatory Visit: Payer: Medicaid Other | Admitting: Interventional Cardiology

## 2017-10-23 ENCOUNTER — Encounter: Payer: Self-pay | Admitting: Interventional Cardiology

## 2017-10-23 VITALS — BP 132/96 | HR 76 | Ht 67.0 in | Wt 186.8 lb

## 2017-10-23 DIAGNOSIS — I10 Essential (primary) hypertension: Secondary | ICD-10-CM

## 2017-10-23 DIAGNOSIS — R072 Precordial pain: Secondary | ICD-10-CM | POA: Diagnosis not present

## 2017-10-23 DIAGNOSIS — R0602 Shortness of breath: Secondary | ICD-10-CM | POA: Diagnosis not present

## 2017-10-23 DIAGNOSIS — R5383 Other fatigue: Secondary | ICD-10-CM | POA: Diagnosis not present

## 2017-10-23 DIAGNOSIS — E118 Type 2 diabetes mellitus with unspecified complications: Secondary | ICD-10-CM

## 2017-10-23 NOTE — Progress Notes (Signed)
Cardiology Office Note   Date:  10/23/2017   ID:  Frank Randolph, DOB [birth year], MRN [id number]  PCP:  Patient, No Pcp Per    No chief complaint on file.  Chest pain  Wt Readings from Last 3 Encounters:  06/13/17 185 lb (83.9 kg)  06/13/17 182 lb (82.6 kg)  02/13/17 178 lb (80.7 kg)       History of Present Illness: Frank Randolph is a 57 y.o. male who is being seen today for the evaluation of chest discomfort at the request of Le, Thao P, DO.  He has had intermittent chest discomfort since December.  He described the chest discomfort as a left-sided, shooting pain that comes and goes.  It occurred mostly at rest.  He denies symptoms such as nausea, vomiting, sweating or leg edema.  He has had some elevated blood pressures.  In December 2018, he had problems with a headache and elevated blood pressure and went to the emergency room.  He has a history of diabetes as well.  He also has chronic hepatitis C.  Sx last a few minutes typically.  It resolves spontaneously.  No triggers to make the pain come on.   Walking is most strenuous exercise.    He has felt "weak" for 2 years and has been unable to work. He has some SHOB with walking.  CP not related to walking.  Denies : Exertional Chest pain. Dizziness. Leg edema. Nitroglycerin use. Orthopnea. Palpitations. Paroxysmal nocturnal dyspnea. Syncope.       Past Medical History:  Diagnosis Date  . Hypertension     Past Surgical History:  Procedure Laterality Date  . BACK SURGERY    . Right knee surgery       Current Outpatient Medications  Medication Sig Dispense Refill  . ACCU-CHEK AVIVA PLUS test strip USE TO TEST BLOOD SUGAR THREE TIMES DAILY AS DIRECTED  2  . glimepiride (AMARYL) 4 MG tablet Take 4 mg by mouth 2 (two) times daily.  2  . GLOBAL INSULIN SYRINGES 30G X 5/16" 0.3 ML MISC USE TO INJECT INSULIN TWICE DAILY (50 DAY SUPPLY) DO NOT SWITCH PATIENT WANTS THIS SIZE ONLY  3  . HUMULIN 70/30 (70-30) 100  UNIT/ML injection INJECT 20 UNITS SUB-Q TWICE DAILY BEFORE MEALS  2  . hydrochlorothiazide (HYDRODIURIL) 25 MG tablet Take 1 tablet by mouth every morning.  0  . lisinopril (PRINIVIL,ZESTRIL) 10 MG tablet Take 1 tablet (10 mg total) by mouth daily. 30 tablet 0  . metFORMIN (GLUCOPHAGE-XR) 500 MG 24 hr tablet Take 1,000 mg by mouth 2 (two) times daily.  2  . propranolol (INDERAL) 20 MG tablet Take 1 tablet by mouth 2 (two) times daily.  5  . triamcinolone cream (KENALOG) 0.1 % APPLY TO AFFECTED AREA TWICE DAILY AS NEEDED  0   No current facility-administered medications for this visit.     Allergies:   Patient has no known allergies.    Social History:  The patient  reports that he has quit smoking. His smoking use included cigarettes. He has never used smokeless tobacco. He reports that he drinks about 3.6 oz of alcohol per week. He reports that he does not use drugs.   Family History:  The patient's family history includes Heart disease in his paternal uncle.    ROS:  Please see the history of present illness.   Otherwise, review of systems are positive for weakness.   All other systems are reviewed and negative.  PHYSICAL EXAM: VS:  BP (!) 132/96 (BP Location: Right Arm, Patient Position: Sitting, Cuff Size: Normal)   Pulse 76   Ht 5\' 7"  (1.702 m)   SpO2 95%   BMI 28.98 kg/m  , BMI Body mass index is 28.98 kg/m. GEN: Well nourished, well developed, in no acute distress  HEENT: normal  Neck: no JVD, carotid bruits, or masses Cardiac: RRR; no murmurs, rubs, or gallops,no edema  Respiratory:  clear to auscultation bilaterally, normal work of breathing GI: soft, nontender, nondistended, + BS MS: no deformity or atrophy  Skin: warm and dry, no rash Neuro:  Strength and sensation are intact Psych: euthymic mood, flat affect   EKG:   The ekg ordered in 3/19 demonstrates NSR, no ST changes   Recent Labs: 06/13/2017: ALT 81; BUN 15; Creatinine, Ser 0.75; Hemoglobin 14.6;  Platelets 182; Potassium 3.8; Sodium 137   Lipid Panel No results found for: CHOL, TRIG, HDL, CHOLHDL, VLDL, LDLCALC, LDLDIRECT   Other studies Reviewed: Additional studies/ records that were reviewed today with results demonstrating: ECG- no ischemic changes on prior ECG.   ASSESSMENT AND PLAN:  1. Precordial chest pain: Atypical.  I do not think his cardiac symptoms explain his generalized weakness.  Will plan for exercise treadmill test to evaluate for ischemic heart disease.  Given the dyspnea on exertion, will also check echocardiogram to evaluate for structural heart disease.  Continue current management with primary care physician. 2. DM: A1C 6.4.   3. HTN: At home, BP in the 130/80s range.  Controlled on his medication.  It was high prior to meds.  He did not has any improvement in energy when his BP control improved.  Orthostatics checked today.  No significant change in blood pressure. 4. H/o Hep C: Cured.   5. Fatigue: Investigate non-cardiac causes as well, with PMD.    Current medicines are reviewed at length with the patient today.  The patient concerns regarding his medicines were addressed.  The following changes have been made:  No change  Labs/ tests ordered today include: ETT, echo No orders of the defined types were placed in this encounter.   Recommend 150 minutes/week of aerobic exercise Low fat, low carb, high fiber diet recommended  Disposition:   FU  for tests   Signed, Lance MussJayadeep Mckaylee Dimalanta, MD  10/23/2017 9:18 AM    [facility] [address], [place], [place]  [address] Phone: [phone]; Fax: [phone]

## 2017-10-23 NOTE — Patient Instructions (Signed)
Medication Instructions:  Your physician recommends that you continue on your current medications as directed. Please refer to the Current Medication list given to you today.   Labwork: None ordered  Testing/Procedures: Your physician has requested that you have an echocardiogram. Echocardiography is a painless test that uses sound waves to create images of your heart. It provides your doctor with information about the size and shape of your heart and how well your heart's chambers and valves are working. This procedure takes approximately one hour. There are no restrictions for this procedure.  Your physician has requested that you have an exercise tolerance test. For further information please visit www.cardiosmart.org. Please also follow instruction sheet, as given.  Follow-Up: Based on test results   Any Other Special Instructions Will Be Listed Below (If Applicable).  Echocardiogram An echocardiogram, or echocardiography, uses sound waves (ultrasound) to produce an image of your heart. The echocardiogram is simple, painless, obtained within a short period of time, and offers valuable information to your health care provider. The images from an echocardiogram can provide information such as:  Evidence of coronary artery disease (CAD).  Heart size.  Heart muscle function.  Heart valve function.  Aneurysm detection.  Evidence of a past heart attack.  Fluid buildup around the heart.  Heart muscle thickening.  Assess heart valve function.  Tell a health care provider about:  Any allergies you have.  All medicines you are taking, including vitamins, herbs, eye drops, creams, and over-the-counter medicines.  Any problems you or family members have had with anesthetic medicines.  Any blood disorders you have.  Any surgeries you have had.  Any medical conditions you have.  Whether you are pregnant or may be pregnant. What happens before the procedure? No special  preparation is needed. Eat and drink normally. What happens during the procedure?  In order to produce an image of your heart, gel will be applied to your chest and a wand-like tool (transducer) will be moved over your chest. The gel will help transmit the sound waves from the transducer. The sound waves will harmlessly bounce off your heart to allow the heart images to be captured in real-time motion. These images will then be recorded.  You may need an IV to receive a medicine that improves the quality of the pictures. What happens after the procedure? You may return to your normal schedule including diet, activities, and medicines, unless your health care provider tells you otherwise. This information is not intended to replace advice given to you by your health care provider. Make sure you discuss any questions you have with your health care provider. Document Released: 06/29/2000 Document Revised: 02/18/2016 Document Reviewed: 03/09/2013 Elsevier Interactive Patient Education  2017 Elsevier Inc.   Exercise Stress Electrocardiogram An exercise stress electrocardiogram is a test to check how blood flows to your heart. It is done to find areas of poor blood flow. You will need to walk on a treadmill for this test. The electrocardiogram will record your heartbeat when you are at rest and when you are exercising. What happens before the procedure?  Do not have drinks with caffeine or foods with caffeine for 24 hours before the test, or as told by your doctor. This includes coffee, tea (even decaf tea), sodas, chocolate, and cocoa.  Follow your doctor's instructions about eating and drinking before the test.  Ask your doctor what medicines you should or should not take before the test. Take your medicines with water unless told by your doctor   not to.  If you use an inhaler, bring it with you to the test.  Bring a snack to eat after the test.  Do not  smoke for 4 hours before the  test.  Do not put lotions, powders, creams, or oils on your chest before the test.  Wear comfortable shoes and clothing. What happens during the procedure?  You will have patches put on your chest. Small areas of your chest may need to be shaved. Wires will be connected to the patches.  Your heart rate will be watched while you are resting and while you are exercising.  You will walk on the treadmill. The treadmill will slowly get faster to raise your heart rate.  The test will take about 1-2 hours. What happens after the procedure?  Your heart rate and blood pressure will be watched after the test.  You may return to your normal diet, activities, and medicines or as told by your doctor. This information is not intended to replace advice given to you by your health care provider. Make sure you discuss any questions you have with your health care provider. Document Released: 12/19/2007 Document Revised: 02/29/2016 Document Reviewed: 03/09/2013 Elsevier Interactive Patient Education  2018 Elsevier Inc.   If you need a refill on your cardiac medications before your next appointment, please call your pharmacy.   

## 2017-11-21 ENCOUNTER — Other Ambulatory Visit: Payer: Self-pay

## 2017-11-21 ENCOUNTER — Ambulatory Visit (INDEPENDENT_AMBULATORY_CARE_PROVIDER_SITE_OTHER): Payer: Medicaid Other

## 2017-11-21 ENCOUNTER — Ambulatory Visit (HOSPITAL_COMMUNITY): Payer: Medicaid Other | Attending: Internal Medicine

## 2017-11-21 DIAGNOSIS — R0602 Shortness of breath: Secondary | ICD-10-CM | POA: Insufficient documentation

## 2017-11-21 DIAGNOSIS — I10 Essential (primary) hypertension: Secondary | ICD-10-CM | POA: Insufficient documentation

## 2017-11-21 DIAGNOSIS — E119 Type 2 diabetes mellitus without complications: Secondary | ICD-10-CM | POA: Insufficient documentation

## 2017-11-21 LAB — EXERCISE TOLERANCE TEST
CHL RATE OF PERCEIVED EXERTION: 17
CSEPED: 5 min
CSEPEDS: 0 s
Estimated workload: 7 METS
MPHR: 164 {beats}/min
Peak HR: 155 {beats}/min
Percent HR: 94 %
Rest HR: 87 {beats}/min

## 2017-11-22 ENCOUNTER — Telehealth: Payer: Self-pay | Admitting: Interventional Cardiology

## 2017-11-22 NOTE — Telephone Encounter (Signed)
New message  Pt verbalized that he is returning rn call  Result Notes for ECHOCARDIOGRAM COMPLETE   Notes recorded by Daleen Bo I, RN on 11/22/2017 at 9:40 AM EDT Left message for patient to call back. ------  Notes recorded by Corky Crafts, MD on 11/21/2017 at 3:20 PM EDT Normal LV function and adequate valvular function. No cardiac etiology of fatigue identified.

## 2017-11-22 NOTE — Telephone Encounter (Signed)
-----   Message from Corky Crafts, MD sent at 11/21/2017  3:19 PM EDT ----- No evidence of ischemia.  Increased exercise should help improve stamina.

## 2017-11-22 NOTE — Telephone Encounter (Signed)
-----   Message from Corky Crafts, MD sent at 11/21/2017  3:20 PM EDT ----- Normal LV function and adequate valvular function.  No cardiac etiology of fatigue identified.

## 2017-11-22 NOTE — Telephone Encounter (Signed)
Patient made aware of echo and stress test results. Patient verbalized understanding and thanked me for the call.

## 2017-11-22 NOTE — Telephone Encounter (Signed)
Attempted to return patient's phone call to give echo and stress test results. Patient did not answer. Left message for patient to call back.

## 2017-12-12 ENCOUNTER — Ambulatory Visit (INDEPENDENT_AMBULATORY_CARE_PROVIDER_SITE_OTHER): Payer: Medicaid Other | Admitting: Internal Medicine

## 2017-12-12 ENCOUNTER — Encounter: Payer: Self-pay | Admitting: Internal Medicine

## 2017-12-12 DIAGNOSIS — Z7185 Encounter for immunization safety counseling: Secondary | ICD-10-CM | POA: Insufficient documentation

## 2017-12-12 DIAGNOSIS — Z7189 Other specified counseling: Secondary | ICD-10-CM

## 2017-12-12 DIAGNOSIS — B182 Chronic viral hepatitis C: Secondary | ICD-10-CM

## 2017-12-12 DIAGNOSIS — K7469 Other cirrhosis of liver: Secondary | ICD-10-CM

## 2017-12-12 NOTE — Assessment & Plan Note (Signed)
Treated and considered cured

## 2017-12-12 NOTE — Progress Notes (Signed)
   Subjective:    Patient ID: Linford Arnold, male    DOB: Jul 31, 1960, 57 y.o.   MRN: [id number]  HPI Here for follow up of chronic hepatitis C and cirrhosis. He was treated with Mavyret for genotype 1a disease and achieved SVR.  His insurance initially denied his elastography test but eventually was able to get it and ultrasound is c/w early cirrhosis.  F2/3 on elastography but was after treatment completion.  F4 on Fibrosure.     Review of Systems  Constitutional: Negative for fatigue.  Gastrointestinal: Negative for diarrhea and nausea.  Skin: Negative for rash.       Objective:   Physical Exam  Constitutional: He appears well-developed and well-nourished. No distress.  HENT:  Mouth/Throat: No oropharyngeal exudate.  Eyes: No scleral icterus.  Cardiovascular: Normal rate, regular rhythm and normal heart sounds.  No murmur heard. Pulmonary/Chest: Effort normal and breath sounds normal. No respiratory distress.  Skin: No rash noted.   SH: no alcohol       Assessment & Plan:

## 2017-12-12 NOTE — Assessment & Plan Note (Signed)
I discussed pneumovax and indication.  Patient refuses.

## 2017-12-12 NOTE — Assessment & Plan Note (Signed)
I discussed results with him and risk of HCC, need for screening.  Will do [place] now and in 6 months.  rtc 1 year.

## 2018-01-14 ENCOUNTER — Other Ambulatory Visit: Payer: Medicaid Other

## 2018-02-10 ENCOUNTER — Ambulatory Visit: Payer: Medicaid Other | Admitting: Internal Medicine

## 2018-03-13 ENCOUNTER — Encounter: Payer: Self-pay | Admitting: Internal Medicine

## 2018-08-25 ENCOUNTER — Encounter: Payer: Self-pay | Admitting: Gastroenterology

## 2018-09-04 ENCOUNTER — Ambulatory Visit (AMBULATORY_SURGERY_CENTER): Payer: Self-pay

## 2018-09-04 VITALS — Ht 67.0 in | Wt 180.4 lb

## 2018-09-04 DIAGNOSIS — Z1211 Encounter for screening for malignant neoplasm of colon: Secondary | ICD-10-CM

## 2018-09-04 MED ORDER — NA SULFATE-K SULFATE-MG SULF 17.5-3.13-1.6 GM/177ML PO SOLN: 1.0000 | Freq: Once | ORAL | 0 refills | Status: AC

## 2018-09-04 NOTE — Progress Notes (Signed)
Per pt, no allergies to soy or egg products.Pt not taking any weight loss meds or using  O2 at home.  Pt refused emmi video. 

## 2018-09-17 ENCOUNTER — Encounter: Payer: Self-pay | Admitting: Gastroenterology

## 2018-09-17 ENCOUNTER — Ambulatory Visit (AMBULATORY_SURGERY_CENTER): Payer: Medicaid Other | Admitting: Gastroenterology

## 2018-09-17 VITALS — BP 111/75 | HR 92 | Temp 97.8°F | Resp 16 | Ht 67.0 in | Wt 180.0 lb

## 2018-09-17 DIAGNOSIS — Z1211 Encounter for screening for malignant neoplasm of colon: Secondary | ICD-10-CM

## 2018-09-17 MED ORDER — SODIUM CHLORIDE 0.9 % IV SOLN: 500.0000 mL | Freq: Once | INTRAVENOUS | Status: DC

## 2018-09-17 NOTE — Patient Instructions (Signed)
Continue present medications    YOU HAD AN ENDOSCOPIC PROCEDURE TODAY AT THE [facility]:   Refer to the procedure report that was given to you for any specific questions about what was found during the examination.  If the procedure report does not answer your questions, please call your gastroenterologist to clarify.  If you requested that your care partner not be given the details of your procedure findings, then the procedure report has been included in a sealed envelope for you to review at your convenience later.  YOU SHOULD EXPECT: Some feelings of bloating in the abdomen. Passage of more gas than usual.  Walking can help get rid of the air that was put into your GI tract during the procedure and reduce the bloating. If you had a lower endoscopy (such as a colonoscopy or flexible sigmoidoscopy) you may notice spotting of blood in your stool or on the toilet paper. If you underwent a bowel prep for your procedure, you may not have a normal bowel movement for a few days.  Please Note:  You might notice some irritation and congestion in your nose or some drainage.  This is from the oxygen used during your procedure.  There is no need for concern and it should clear up in a day or so.  SYMPTOMS TO REPORT IMMEDIATELY:   Following lower endoscopy (colonoscopy or flexible sigmoidoscopy):  Excessive amounts of blood in the stool  Significant tenderness or worsening of abdominal pains  Swelling of the abdomen that is new, acute  Fever of 100F or higher   For urgent or emergent issues, a gastroenterologist can be reached at any hour by calling [phone].   DIET:  We do recommend a small meal at first, but then you may proceed to your regular diet.  Drink plenty of fluids but you should avoid alcoholic beverages for 24 hours.  ACTIVITY:  You should plan to take it easy for the rest of today and you should NOT DRIVE or use heavy machinery until tomorrow (because of the sedation  medicines used during the test).    FOLLOW UP: Our staff will call the number listed on your records the next business day following your procedure to check on you and address any questions or concerns that you may have regarding the information given to you following your procedure. If we do not reach you, we will leave a message.  However, if you are feeling well and you are not experiencing any problems, there is no need to return our call.  We will assume that you have returned to your regular daily activities without incident.  If any biopsies were taken you will be contacted by phone or by letter within the next 1-3 weeks.  Please call us at [phone] if you have not heard about the biopsies in 3 weeks.    SIGNATURES/CONFIDENTIALITY: You and/or your care partner have signed paperwork which will be entered into your electronic medical record.  These signatures attest to the fact that that the information above on your After Visit Summary has been reviewed and is understood.  Full responsibility of the confidentiality of this discharge information lies with you and/or your care-partner. 

## 2018-09-17 NOTE — Op Note (Signed)
[facility] Patient Name: Frank Randolph Procedure Date: 09/17/2018 10:06 AM MRN: [id number] Endoscopist: Sherilyn Cooter L. Myrtie Neither , MD Age: 58 Referring MD:  Date of Birth: 09/20/1960 Gender: Male Account #: [id number] Procedure:                Colonoscopy Indications:              Screening for colorectal malignant neoplasm, This                            is the patient's first colonoscopy Medicines:                Monitored Anesthesia Care Procedure:                Pre-Anesthesia Assessment:                           - Prior to the procedure, a History and Physical                            was performed, and patient medications and                            allergies were reviewed. The patient's tolerance of                            previous anesthesia was also reviewed. The risks                            and benefits of the procedure and the sedation                            options and risks were discussed with the patient.                            All questions were answered, and informed consent                            was obtained. Prior Anticoagulants: The patient has                            taken no previous anticoagulant or antiplatelet                            agents. ASA Grade Assessment: II - A patient with                            mild systemic disease. After reviewing the risks                            and benefits, the patient was deemed in                            satisfactory condition to undergo the procedure.  After obtaining informed consent, the colonoscope                            was passed under direct vision. Throughout the                            procedure, the patient's blood pressure, pulse, and                            oxygen saturations were monitored continuously. The                            Colonoscope was introduced through the anus and                            advanced to the the cecum,  identified by                            appendiceal orifice and ileocecal valve. The                            colonoscopy was performed without difficulty. The                            patient tolerated the procedure well. The quality                            of the bowel preparation was good. The ileocecal                            valve, appendiceal orifice, and rectum were                            photographed. The bowel preparation used was SUPREP. Scope In: 10:19:02 AM Scope Out: 10:30:49 AM Scope Withdrawal Time: 0 hours 9 minutes 56 seconds  Total Procedure Duration: 0 hours 11 minutes 47 seconds  Findings:                 The perianal and digital rectal examinations were                            normal.                           The entire examined colon appeared normal on direct                            and retroflexion views. Complications:            No immediate complications. Estimated Blood Loss:     Estimated blood loss: none. Impression:               - The entire examined colon is normal on direct and  retroflexion views.                           - No specimens collected. Recommendation:           - Patient has a contact number available for                            emergencies. The signs and symptoms of potential                            delayed complications were discussed with the                            patient. Return to normal activities tomorrow.                            Written discharge instructions were provided to the                            patient.                           - Resume previous diet.                           - Continue present medications.                           - Repeat colonoscopy in 10 years for screening                            purposes. Jaziel Bennett L. Myrtie Neither, MD 09/17/2018 10:34:01 AM This report has been signed electronically.

## 2018-09-17 NOTE — Progress Notes (Signed)
Pt's states no medical or surgical changes since previsit or office visit. 

## 2018-09-17 NOTE — Progress Notes (Signed)
Alert and oriented x3, pleased with MAC, report to RN  

## 2018-09-18 ENCOUNTER — Telehealth: Payer: Self-pay | Admitting: *Deleted

## 2018-09-18 NOTE — Telephone Encounter (Signed)
  Follow up Call-  Call back number 09/17/2018  Post procedure Call Back phone  # [phone]  Permission to leave phone message Yes  Some recent data might be hidden     Patient questions:  Machine came on and asked me to place numbers to prove that I wasn't a robo call.  It never rang after that.  Second call.

## 2018-09-18 NOTE — Telephone Encounter (Signed)
  Follow up Call-  Call back number 09/17/2018  Post procedure Call Back phone  # [phone]  Permission to leave phone message Yes  Some recent data might be hidden     Patient questions:  Phone rings and then disconnects. No message left.

## 2018-12-04 ENCOUNTER — Telehealth: Payer: Self-pay | Admitting: Internal Medicine

## 2018-12-04 NOTE — Telephone Encounter (Signed)
COVID-19 Pre-Screening Questions: ° °Do you currently have a fever (>100 °F), chills or unexplained body aches? No  ° °Are you currently experiencing new cough, shortness of breath, sore throat, runny nose? No  °•  °Have you recently travelled outside the state of [place] in the last 14 days? No  °•  °1. Have you been in contact with someone that is currently pending confirmation of Covid19 testing or has been confirmed to have the Covid19 virus?  No  ° °

## 2018-12-09 ENCOUNTER — Ambulatory Visit: Payer: Medicaid Other | Admitting: Internal Medicine

## 2019-12-17 ENCOUNTER — Other Ambulatory Visit: Payer: Self-pay | Admitting: Family Medicine

## 2019-12-17 ENCOUNTER — Ambulatory Visit
Admission: RE | Admit: 2019-12-17 | Discharge: 2019-12-17 | Disposition: A | Discharge status: Home / Self Care | Payer: Medicaid Other | Source: Ambulatory Visit | Attending: Family Medicine | Admitting: Family Medicine

## 2019-12-17 DIAGNOSIS — G8929 Other chronic pain: Secondary | ICD-10-CM

## 2019-12-17 DIAGNOSIS — M545 Low back pain, unspecified: Secondary | ICD-10-CM

## 2019-12-21 ENCOUNTER — Other Ambulatory Visit: Payer: Self-pay | Admitting: Family Medicine

## 2019-12-21 DIAGNOSIS — R7989 Other specified abnormal findings of blood chemistry: Secondary | ICD-10-CM

## 2019-12-24 ENCOUNTER — Ambulatory Visit (HOSPITAL_COMMUNITY)
Admission: RE | Admit: 2019-12-24 | Discharge: 2019-12-24 | Disposition: A | Discharge status: Home / Self Care | Payer: Medicaid Other | Source: Ambulatory Visit | Attending: Family Medicine | Admitting: Family Medicine

## 2019-12-24 ENCOUNTER — Other Ambulatory Visit: Payer: Self-pay

## 2019-12-24 DIAGNOSIS — R7989 Other specified abnormal findings of blood chemistry: Secondary | ICD-10-CM

## 2019-12-24 DIAGNOSIS — R945 Abnormal results of liver function studies: Secondary | ICD-10-CM | POA: Insufficient documentation

## 2020-03-29 ENCOUNTER — Other Ambulatory Visit: Payer: Self-pay | Admitting: Family Medicine

## 2020-03-29 DIAGNOSIS — N2889 Other specified disorders of kidney and ureter: Secondary | ICD-10-CM

## 2020-04-04 ENCOUNTER — Ambulatory Visit
Admission: RE | Admit: 2020-04-04 | Discharge: 2020-04-04 | Disposition: A | Discharge status: Home / Self Care | Payer: Medicaid Other | Source: Ambulatory Visit | Attending: Family Medicine | Admitting: Family Medicine

## 2020-04-04 DIAGNOSIS — N2889 Other specified disorders of kidney and ureter: Secondary | ICD-10-CM

## 2020-09-05 ENCOUNTER — Other Ambulatory Visit: Payer: Self-pay | Admitting: Nurse Practitioner

## 2020-09-05 DIAGNOSIS — K760 Fatty (change of) liver, not elsewhere classified: Secondary | ICD-10-CM

## 2020-09-05 DIAGNOSIS — K7401 Hepatic fibrosis, early fibrosis: Secondary | ICD-10-CM

## 2020-09-27 ENCOUNTER — Other Ambulatory Visit: Payer: Medicaid Other

## 2020-09-30 ENCOUNTER — Ambulatory Visit
Admission: RE | Admit: 2020-09-30 | Discharge: 2020-09-30 | Disposition: A | Discharge status: Home / Self Care | Payer: Medicaid Other | Source: Ambulatory Visit | Attending: Nurse Practitioner | Admitting: Nurse Practitioner

## 2020-09-30 DIAGNOSIS — K760 Fatty (change of) liver, not elsewhere classified: Secondary | ICD-10-CM

## 2020-09-30 DIAGNOSIS — K7401 Hepatic fibrosis, early fibrosis: Secondary | ICD-10-CM

## 2021-02-27 ENCOUNTER — Other Ambulatory Visit: Payer: Medicaid Other

## 2021-03-07 ENCOUNTER — Other Ambulatory Visit: Payer: Self-pay | Admitting: Nurse Practitioner

## 2021-03-07 DIAGNOSIS — K7402 Hepatic fibrosis, advanced fibrosis: Secondary | ICD-10-CM

## 2021-03-07 DIAGNOSIS — K7581 Nonalcoholic steatohepatitis (NASH): Secondary | ICD-10-CM

## 2021-03-13 ENCOUNTER — Ambulatory Visit
Admission: RE | Admit: 2021-03-13 | Discharge: 2021-03-13 | Disposition: A | Discharge status: Home / Self Care | Payer: Medicaid Other | Source: Ambulatory Visit | Attending: Nurse Practitioner | Admitting: Nurse Practitioner

## 2021-03-13 ENCOUNTER — Other Ambulatory Visit: Payer: Self-pay

## 2021-03-13 DIAGNOSIS — K7581 Nonalcoholic steatohepatitis (NASH): Secondary | ICD-10-CM

## 2021-03-13 DIAGNOSIS — K7402 Hepatic fibrosis, advanced fibrosis: Secondary | ICD-10-CM

## 2021-04-04 ENCOUNTER — Other Ambulatory Visit: Payer: Self-pay | Admitting: Neurosurgery

## 2021-04-04 DIAGNOSIS — M5441 Lumbago with sciatica, right side: Secondary | ICD-10-CM

## 2021-04-30 ENCOUNTER — Other Ambulatory Visit: Payer: Self-pay

## 2021-04-30 ENCOUNTER — Ambulatory Visit
Admission: RE | Admit: 2021-04-30 | Discharge: 2021-04-30 | Disposition: A | Discharge status: Home / Self Care | Payer: Medicaid Other | Source: Ambulatory Visit | Attending: Neurosurgery | Admitting: Neurosurgery

## 2021-04-30 DIAGNOSIS — M5441 Lumbago with sciatica, right side: Secondary | ICD-10-CM

## 2021-05-02 ENCOUNTER — Other Ambulatory Visit: Payer: Self-pay | Admitting: Neurosurgery

## 2021-05-15 ENCOUNTER — Other Ambulatory Visit (HOSPITAL_COMMUNITY): Payer: Medicaid Other

## 2021-05-17 ENCOUNTER — Ambulatory Visit (HOSPITAL_COMMUNITY): Admission: RE | Admit: 2021-05-17 | Payer: Medicaid Other | Source: Ambulatory Visit | Admitting: Neurosurgery

## 2021-05-17 ENCOUNTER — Encounter (HOSPITAL_COMMUNITY): Admission: RE | Payer: Self-pay | Source: Ambulatory Visit

## 2021-05-17 SURGERY — LUMBAR LAMINECTOMY/DECOMPRESSION MICRODISCECTOMY 1 LEVEL
Anesthesia: General

## 2021-08-21 ENCOUNTER — Other Ambulatory Visit: Payer: Self-pay | Admitting: Neurosurgery

## 2021-09-15 NOTE — Progress Notes (Signed)
Surgical Instructions ? ? ? Your procedure is scheduled on Wednesday, March 8th, 2023. ? ? Report to [facility] Main Entrance "A" at 06:30 A.M., then check in with the Admitting office. ? Call this number if you have problems the morning of surgery: ? [phone] ? ? If you have any questions prior to your surgery date call [phone]: Open Monday-Friday 8am-4pm ? ? ? Remember: ? Do not eat after midnight the night before your surgery ? ?You may drink clear liquids until 05:30 the morning of your surgery.   ?Clear liquids allowed are: Water, Non-Citrus Juices (without pulp), Carbonated Beverages, Clear Tea, Black Coffee ONLY (NO MILK, CREAM OR POWDERED CREAMER of any kind), and Gatorade ?  ? Take these medicines the morning of surgery with A SIP OF WATER:  ? ?carvedilol (COREG)  ?sertraline (ZOLOFT) ?cetirizine (ZYRTEC) - as needed ? ?As of today, STOP taking any Aspirin (unless otherwise instructed by your surgeon) Aleve, Naproxen, Ibuprofen, Motrin, Advil, Goody's, BC's, all herbal medications, fish oil, and all vitamins. ? ? ?WHAT DO I DO ABOUT MY DIABETES MEDICATION? ? ? ?Do not take glimepiride (AMARYL) the night before surgery and the morning of surgery. ? ?Do not take metFORMIN (GLUCOPHAGE-XR) the day of surgery. ? ?THE NIGHT BEFORE SURGERY, take 17 units of HUMULIN 70/30 - 70% of your regular dose  ? ?Do not take HUMULIN 70/30 the morning of surgery    ? ? ?HOW TO MANAGE YOUR DIABETES ?BEFORE AND AFTER SURGERY ? ?Why is it important to control my blood sugar before and after surgery? ?Improving blood sugar levels before and after surgery helps healing and can limit problems. ?A way of improving blood sugar control is eating a healthy diet by: ? Eating less sugar and carbohydrates ? Increasing activity/exercise ? Talking with your doctor about reaching your blood sugar goals ?High blood sugars (greater than 180 mg/dL) can raise your risk of infections and slow your recovery, so you will need to focus on  controlling your diabetes during the weeks before surgery. ?Make sure that the doctor who takes care of your diabetes knows about your planned surgery including the date and location. ? ?How do I manage my blood sugar before surgery? ?Check your blood sugar at least 4 times a day, starting 2 days before surgery, to make sure that the level is not too high or low. ? ?Check your blood sugar the morning of your surgery when you wake up and every 2 hours until you get to the Short Stay unit. ? ?If your blood sugar is less than 70 mg/dL, you will need to treat for low blood sugar: ?Do not take insulin. ?Treat a low blood sugar (less than 70 mg/dL) with ? cup of clear juice (cranberry or apple), 4 glucose tablets, OR glucose gel. ?Recheck blood sugar in 15 minutes after treatment (to make sure it is greater than 70 mg/dL). If your blood sugar is not greater than 70 mg/dL on recheck, call [phone] for further instructions. ?Report your blood sugar to the short stay nurse when you get to Short Stay. ? ?If you are admitted to the hospital after surgery: ?Your blood sugar will be checked by the staff and you will probably be given insulin after surgery (instead of oral diabetes medicines) to make sure you have good blood sugar levels. ?The goal for blood sugar control after surgery is 80-180 mg/dL.  ? ? ? The day of surgery: ?         ?Do  not wear jewelry  ?Do not wear lotions, powders, colognes, or deodorant. ?Men may shave face and neck. ?Do not bring valuables to the hospital. ? ? ?[employer] is not responsible for any belongings or valuables. .  ? ?Do NOT Smoke (Tobacco/Vaping)  24 hours prior to your procedure ? ?If you use a CPAP at night, you may bring your mask for your overnight stay. ?  ?Contacts, glasses, hearing aids, dentures or partials may not be worn into surgery, please bring cases for these belongings ?  ?For patients admitted to the hospital, discharge time will be determined by your treatment team. ?   ?Patients discharged the day of surgery will not be allowed to drive home, and someone needs to stay with them for 24 hours. ? ?NO VISITORS WILL BE ALLOWED IN PRE-OP WHERE PATIENTS ARE PREPPED FOR SURGERY.  ONLY 1 SUPPORT PERSON MAY BE PRESENT IN THE WAITING ROOM WHILE YOU ARE IN SURGERY.  IF YOU ARE TO BE ADMITTED, ONCE YOU ARE IN YOUR ROOM YOU WILL BE ALLOWED TWO (2) VISITORS. 1 (ONE) VISITOR MAY STAY OVERNIGHT BUT MUST ARRIVE TO THE ROOM BY 8pm.  Minor children may have two parents present. Special consideration for safety and communication needs will be reviewed on a case by case basis. ? ?Special instructions:   ? ?Oral Hygiene is also important to reduce your risk of infection.  Remember - BRUSH YOUR TEETH THE MORNING OF SURGERY WITH YOUR REGULAR TOOTHPASTE ? ? ?[place] [facility]- Preparing For Surgery ? ?Before surgery, you can play an important role. Because skin is not sterile, your skin needs to be as free of germs as possible. You can reduce the number of germs on your skin by washing with CHG (chlorahexidine gluconate) Soap before surgery.  CHG is an antiseptic cleaner which kills germs and bonds with the skin to continue killing germs even after washing.   ? ? ?Please do not use if you have an allergy to CHG or antibacterial soaps. If your skin becomes reddened/irritated stop using the CHG.  ?Do not shave (including legs and underarms) for at least 48 hours prior to first CHG shower. It is OK to shave your face. ? ?Please follow these instructions carefully. ?  ? ? Shower the NIGHT BEFORE SURGERY and the MORNING OF SURGERY with CHG Soap.  ? If you chose to wash your hair, wash your hair first as usual with your normal shampoo. After you shampoo, rinse your hair and body thoroughly to remove the shampoo.  Then [employer] and genitals (private parts) with your normal soap and rinse thoroughly to remove soap. ? ?After that Use CHG Soap as you would any other liquid soap. You can apply CHG directly to the skin  and wash gently with a scrungie or a clean washcloth.  ? ?Apply the CHG Soap to your body ONLY FROM THE NECK DOWN.  Do not use on open wounds or open sores. Avoid contact with your eyes, ears, mouth and genitals (private parts). Wash Face and genitals (private parts)  with your normal soap.  ? ?Wash thoroughly, paying special attention to the area where your surgery will be performed. ? ?Thoroughly rinse your body with warm water from the neck down. ? ?DO NOT shower/wash with your normal soap after using and rinsing off the CHG Soap. ? ?Pat yourself dry with a CLEAN TOWEL. ? ?Wear CLEAN PAJAMAS to bed the night before surgery ? ?Place CLEAN SHEETS on your bed the night before your  surgery ? ?DO NOT SLEEP WITH PETS. ? ? ?Day of Surgery: ? ?Take a shower with CHG soap. ?Wear Clean/Comfortable clothing the morning of surgery ?Do not apply any deodorants/lotions.   ?Remember to brush your teeth WITH YOUR REGULAR TOOTHPASTE. ? ? ? ?COVID testing ? ?If you are going to stay overnight or be admitted after your procedure/surgery and require a pre-op COVID test, please follow these instructions after your COVID test  ? ?You are not required to quarantine however you are required to wear a well-fitting mask when you are out and around people not in your household.  If your mask becomes wet or soiled, replace with a new one. ? ?Wash your hands often with soap and water for 20 seconds or clean your hands with an alcohol-based hand sanitizer that contains at least 60% alcohol. ? ?Do not share personal items. ? ?Notify your provider: ?if you are in close contact with someone who has COVID  ?or if you develop a fever of 100.4 or greater, sneezing, cough, sore throat, shortness of breath or body aches. ? ?  ?Please read over the following fact sheets that you were given.   ?

## 2021-09-18 ENCOUNTER — Other Ambulatory Visit: Payer: Self-pay

## 2021-09-18 ENCOUNTER — Encounter (HOSPITAL_COMMUNITY)
Admission: RE | Admit: 2021-09-18 | Discharge: 2021-09-18 | Disposition: A | Discharge status: Home / Self Care | Payer: Medicaid Other | Source: Ambulatory Visit | Attending: Neurosurgery | Admitting: Neurosurgery

## 2021-09-18 ENCOUNTER — Encounter (HOSPITAL_COMMUNITY): Payer: Self-pay

## 2021-09-18 VITALS — BP 137/87 | HR 92 | Temp 97.5°F | Resp 17 | Ht 67.0 in | Wt 167.4 lb

## 2021-09-18 DIAGNOSIS — Z01818 Encounter for other preprocedural examination: Secondary | ICD-10-CM | POA: Diagnosis present

## 2021-09-18 DIAGNOSIS — E119 Type 2 diabetes mellitus without complications: Secondary | ICD-10-CM | POA: Diagnosis not present

## 2021-09-18 DIAGNOSIS — Z20822 Contact with and (suspected) exposure to covid-19: Secondary | ICD-10-CM | POA: Insufficient documentation

## 2021-09-18 DIAGNOSIS — B182 Chronic viral hepatitis C: Secondary | ICD-10-CM | POA: Diagnosis not present

## 2021-09-18 HISTORY — DX: Anxiety disorder, unspecified: F41.9

## 2021-09-18 HISTORY — DX: Unspecified osteoarthritis, unspecified site: M19.90

## 2021-09-18 HISTORY — DX: Other specified abnormal immunological findings in serum: R76.8

## 2021-09-18 HISTORY — DX: Hyperlipidemia, unspecified: E78.5

## 2021-09-18 HISTORY — DX: Fatty (change of) liver, not elsewhere classified: K76.0

## 2021-09-18 HISTORY — DX: Other specified disorders of kidney and ureter: N28.89

## 2021-09-18 HISTORY — DX: Type 2 diabetes mellitus with diabetic polyneuropathy: E11.42

## 2021-09-18 HISTORY — DX: Psoriasis, unspecified: L40.9

## 2021-09-18 HISTORY — DX: Depression, unspecified: F32.A

## 2021-09-18 LAB — COMPREHENSIVE METABOLIC PANEL
ALT: 31 U/L (ref 0–44)
AST: 31 U/L (ref 15–41)
Albumin: 4.7 g/dL (ref 3.5–5.0)
Alkaline Phosphatase: 61 U/L (ref 38–126)
Anion gap: 13 (ref 5–15)
BUN: 20 mg/dL (ref 6–20)
CO2: 27 mmol/L (ref 22–32)
Calcium: 9.5 mg/dL (ref 8.9–10.3)
Chloride: 96 mmol/L — ABNORMAL LOW (ref 98–111)
Creatinine, Ser: 1.14 mg/dL (ref 0.61–1.24)
GFR, Estimated: >60 mL/min (ref >60)
Glucose, Bld: 179 mg/dL — ABNORMAL HIGH (ref 70–99)
Potassium: 3.9 mmol/L (ref 3.5–5.1)
Sodium: 136 mmol/L (ref 135–145)
Total Bilirubin: 1.4 mg/dL — ABNORMAL HIGH (ref 0.3–1.2)
Total Protein: 8 g/dL (ref 6.5–8.1)

## 2021-09-18 LAB — CBC
HCT: 48 % (ref 39.0–52.0)
Hemoglobin: 17.2 g/dL — ABNORMAL HIGH (ref 13.0–17.0)
MCH: 32 pg (ref 26.0–34.0)
MCHC: 35.8 g/dL (ref 30.0–36.0)
MCV: 89.2 fL (ref 80.0–100.0)
Platelets: 202 10*3/uL (ref 150–400)
RBC: 5.38 MIL/uL (ref 4.22–5.81)
RDW: 12.9 % (ref 11.5–15.5)
WBC: 11.6 10*3/uL — ABNORMAL HIGH (ref 4.0–10.5)
nRBC: 0 % (ref 0.0–0.2)

## 2021-09-18 LAB — SURGICAL PCR SCREEN
MRSA, PCR: NEGATIVE
Staphylococcus aureus: NEGATIVE

## 2021-09-18 LAB — GLUCOSE, CAPILLARY: Glucose-Capillary: 182 mg/dL — ABNORMAL HIGH (ref 70–99)

## 2021-09-18 LAB — SARS CORONAVIRUS 2 (TAT 6-24 HRS): SARS Coronavirus 2: NEGATIVE

## 2021-09-18 NOTE — Progress Notes (Signed)
PCP - Dr. Henrine Screws ?Cardiologist - patient denies ? ?PPM/ICD - n/a ?Device Orders -  ?Rep Notified -  ? ?Chest x-ray - n/a ?EKG - 09/18/2021 ?Stress Test -  11/21/17, normal ?ECHO - 11/21/17, normal ?Cardiac Cath - patient denies  ? ?Sleep Study - patient denies ?CPAP -  ? ?Fasting Blood Sugar - 140s ?Checks Blood Sugar 2  times a day ? ?Blood Thinner Instructions: n/a ?Aspirin Instructions: n/a ? ?ERAS Protcol - clears until 05:30 ?PRE-SURGERY Ensure or G2- none ordered ? ?COVID TEST-  at PAT appointment ? ? ?Anesthesia review: n/a ? ?Patient denies shortness of breath, fever, cough and chest pain at PAT appointment ? ? ?All instructions explained to the patient, with a verbal understanding of the material. Patient agrees to go over the instructions while at home for a better understanding. Patient also instructed to self quarantine after being tested for COVID-19. The opportunity to ask questions was provided. ? ? ?

## 2021-09-19 NOTE — Anesthesia Preprocedure Evaluation (Addendum)
Anesthesia Evaluation  ?Patient identified by MRN, date of birth, ID band ?Patient awake ? ? ? ?Reviewed: ?Allergy & Precautions, NPO status , Patient's Chart, lab work & pertinent test results ? ?Airway ?Mallampati: II ? ?TM Distance: >3 FB ?Neck ROM: Full ? ? ? Dental ?no notable dental hx. ?(+) Dental Advisory Given ?  ?Pulmonary ?neg pulmonary ROS, former smoker,  ?  ?Pulmonary exam normal ?breath sounds clear to auscultation ? ? ? ? ? ? Cardiovascular ?hypertension, Pt. on home beta blockers and Pt. on medications ?Normal cardiovascular exam ?Rhythm:Regular Rate:Normal ? ? ?  ?Neuro/Psych ?PSYCHIATRIC DISORDERS Anxiety Depression  Neuromuscular disease   ? GI/Hepatic ?GERD  ,(+) Hepatitis -  ?Endo/Other  ?diabetes ? Renal/GU ?negative Renal ROS  ? ?  ?Musculoskeletal ? ?(+) Arthritis ,  ? Abdominal ?  ?Peds ? Hematology ?negative hematology ROS ?(+)   ?Anesthesia Other Findings ? ? Reproductive/Obstetrics ? ?  ? ? ? ? ? ? ? ? ? ? ? ? ? ?  ?  ? ? ? ? ? ? ? ?Anesthesia Physical ?Anesthesia Plan ? ?ASA: 2 ? ?Anesthesia Plan: General  ? ?Post-op Pain Management: Tylenol PO (pre-op)* and Gabapentin PO (pre-op)*  ? ?Induction: Intravenous ? ?PONV Risk Score and Plan: 3 and Ondansetron, Dexamethasone, Treatment may vary due to age or medical condition, Midazolam and Diphenhydramine ? ?Airway Management Planned: Oral ETT ? ?Additional Equipment: None ? ?Intra-op Plan:  ? ?Post-operative Plan: Extubation in OR ? ?Informed Consent: I have reviewed the patients History and Physical, chart, labs and discussed the procedure including the risks, benefits and alternatives for the proposed anesthesia with the patient or authorized representative who has indicated his/her understanding and acceptance.  ? ? ? ?Dental advisory given ? ?Plan Discussed with: CRNA ? ?Anesthesia Plan Comments:   ? ? ? ? ? ?Anesthesia Quick Evaluation ? ?

## 2021-09-20 ENCOUNTER — Encounter (HOSPITAL_COMMUNITY): Payer: Self-pay | Admitting: Neurosurgery

## 2021-09-20 ENCOUNTER — Ambulatory Visit (HOSPITAL_COMMUNITY): Payer: Medicaid Other | Admitting: Anesthesiology

## 2021-09-20 ENCOUNTER — Ambulatory Visit (HOSPITAL_BASED_OUTPATIENT_CLINIC_OR_DEPARTMENT_OTHER): Payer: Medicaid Other | Admitting: Anesthesiology

## 2021-09-20 ENCOUNTER — Ambulatory Visit (HOSPITAL_COMMUNITY): Payer: Medicaid Other

## 2021-09-20 ENCOUNTER — Encounter (HOSPITAL_COMMUNITY)
Admission: RE | Disposition: A | Discharge status: Home / Self Care | Payer: Self-pay | Source: Home / Self Care | Attending: Neurosurgery

## 2021-09-20 ENCOUNTER — Other Ambulatory Visit: Payer: Self-pay

## 2021-09-20 ENCOUNTER — Ambulatory Visit (HOSPITAL_COMMUNITY)
Admission: RE | Admit: 2021-09-20 | Discharge: 2021-09-20 | Disposition: A | Discharge status: Home / Self Care | Payer: Medicaid Other | Attending: Neurosurgery | Admitting: Neurosurgery

## 2021-09-20 DIAGNOSIS — Z419 Encounter for procedure for purposes other than remedying health state, unspecified: Secondary | ICD-10-CM

## 2021-09-20 DIAGNOSIS — M5126 Other intervertebral disc displacement, lumbar region: Secondary | ICD-10-CM | POA: Diagnosis present

## 2021-09-20 DIAGNOSIS — I10 Essential (primary) hypertension: Secondary | ICD-10-CM | POA: Insufficient documentation

## 2021-09-20 DIAGNOSIS — E119 Type 2 diabetes mellitus without complications: Secondary | ICD-10-CM | POA: Diagnosis not present

## 2021-09-20 HISTORY — PX: LUMBAR LAMINECTOMY/DECOMPRESSION MICRODISCECTOMY: SHX5026

## 2021-09-20 LAB — GLUCOSE, CAPILLARY
Glucose-Capillary: 199 mg/dL — ABNORMAL HIGH (ref 70–99)
Glucose-Capillary: 127 mg/dL — ABNORMAL HIGH (ref 70–99)
Glucose-Capillary: 147 mg/dL — ABNORMAL HIGH (ref 70–99)

## 2021-09-20 SURGERY — LUMBAR LAMINECTOMY/DECOMPRESSION MICRODISCECTOMY 1 LEVEL
Anesthesia: General | Site: Spine Lumbar | Laterality: Bilateral

## 2021-09-20 MED ORDER — CHLORHEXIDINE GLUCONATE 0.12 % MT SOLN
15.0000 mL | Freq: Once | OROMUCOSAL | Status: AC
  Administered 2021-09-20: 15 mL via OROMUCOSAL
  Filled 2021-09-20: qty 15

## 2021-09-20 MED ORDER — THROMBIN 5000 UNITS EX SOLR
CUTANEOUS | Status: DC | PRN
  Administered 2021-09-20: 10000 [IU] via TOPICAL

## 2021-09-20 MED ORDER — DEXAMETHASONE SODIUM PHOSPHATE 10 MG/ML IJ SOLN
INTRAMUSCULAR | Status: DC | PRN
Start: 2021-09-20 — End: 2021-09-20
  Administered 2021-09-20: 5 mg via INTRAVENOUS

## 2021-09-20 MED ORDER — OXYCODONE HCL 5 MG PO TABS: 5.0000 mg | ORAL_TABLET | Freq: Four times a day (QID) | ORAL | 0 refills | Status: AC | PRN

## 2021-09-20 MED ORDER — FENTANYL CITRATE (PF) 250 MCG/5ML IJ SOLN
INTRAMUSCULAR | Status: DC | PRN
  Administered 2021-09-20: 50 ug via INTRAVENOUS
  Administered 2021-09-20: 100 ug via INTRAVENOUS

## 2021-09-20 MED ORDER — GABAPENTIN 300 MG PO CAPS
300.0000 mg | ORAL_CAPSULE | Freq: Once | ORAL | Status: AC
  Administered 2021-09-20: 300 mg via ORAL
  Filled 2021-09-20: qty 1

## 2021-09-20 MED ORDER — LIDOCAINE-EPINEPHRINE 0.5 %-1:200000 IJ SOLN
INTRAMUSCULAR | Status: DC | PRN
  Administered 2021-09-20: 6 mL

## 2021-09-20 MED ORDER — SUGAMMADEX SODIUM 200 MG/2ML IV SOLN
INTRAVENOUS | Status: DC | PRN
Start: 2021-09-20 — End: 2021-09-20
  Administered 2021-09-20: 150 mg via INTRAVENOUS

## 2021-09-20 MED ORDER — DROPERIDOL 2.5 MG/ML IJ SOLN: 0.6250 mg | Freq: Once | INTRAMUSCULAR | Status: DC | PRN

## 2021-09-20 MED ORDER — MEPERIDINE HCL 25 MG/ML IJ SOLN: 6.2500 mg | INTRAMUSCULAR | Status: DC | PRN

## 2021-09-20 MED ORDER — TIZANIDINE HCL 4 MG PO TABS: 4.0000 mg | ORAL_TABLET | Freq: Four times a day (QID) | ORAL | 0 refills | Status: AC | PRN

## 2021-09-20 MED ORDER — PROPOFOL 10 MG/ML IV BOLUS
INTRAVENOUS | Status: DC | PRN
  Administered 2021-09-20: 140 mg via INTRAVENOUS

## 2021-09-20 MED ORDER — CEFAZOLIN SODIUM-DEXTROSE 2-4 GM/100ML-% IV SOLN
2.0000 g | INTRAVENOUS | Status: AC
  Administered 2021-09-20: 2 g via INTRAVENOUS
  Filled 2021-09-20: qty 100

## 2021-09-20 MED ORDER — INSULIN ASPART 100 UNIT/ML IJ SOLN
INTRAMUSCULAR | Status: DC
Start: 2021-09-20 — End: 2021-09-20
  Filled 2021-09-20: qty 1

## 2021-09-20 MED ORDER — PHENYLEPHRINE HCL-NACL 20-0.9 MG/250ML-% IV SOLN
INTRAVENOUS | Status: DC | PRN
  Administered 2021-09-20: 25 ug/min via INTRAVENOUS

## 2021-09-20 MED ORDER — MIDAZOLAM HCL 2 MG/2ML IJ SOLN
INTRAMUSCULAR | Status: AC
  Filled 2021-09-20: qty 2

## 2021-09-20 MED ORDER — AMISULPRIDE (ANTIEMETIC) 5 MG/2ML IV SOLN: 10.0000 mg | Freq: Once | INTRAVENOUS | Status: DC | PRN

## 2021-09-20 MED ORDER — ACETAMINOPHEN 500 MG PO TABS
1000.0000 mg | ORAL_TABLET | Freq: Once | ORAL | Status: AC
  Administered 2021-09-20: 1000 mg via ORAL
  Filled 2021-09-20: qty 2

## 2021-09-20 MED ORDER — MIDAZOLAM HCL 2 MG/2ML IJ SOLN
INTRAMUSCULAR | Status: DC | PRN
  Administered 2021-09-20: 2 mg via INTRAVENOUS

## 2021-09-20 MED ORDER — CHLORHEXIDINE GLUCONATE CLOTH 2 % EX PADS: 6.0000 | MEDICATED_PAD | Freq: Once | CUTANEOUS | Status: DC

## 2021-09-20 MED ORDER — INSULIN ASPART 100 UNIT/ML IJ SOLN
0.0000 [IU] | INTRAMUSCULAR | Status: DC | PRN
  Administered 2021-09-20: 4 [IU] via SUBCUTANEOUS

## 2021-09-20 MED ORDER — LIDOCAINE 2% (20 MG/ML) 5 ML SYRINGE
INTRAMUSCULAR | Status: DC | PRN
Start: 2021-09-20 — End: 2021-09-20
  Administered 2021-09-20: 80 mg via INTRAVENOUS

## 2021-09-20 MED ORDER — BUPIVACAINE HCL (PF) 0.25 % IJ SOLN
INTRAMUSCULAR | Status: DC | PRN
  Administered 2021-09-20: 20 mL

## 2021-09-20 MED ORDER — LACTATED RINGERS IV SOLN: INTRAVENOUS | Status: DC

## 2021-09-20 MED ORDER — THROMBIN 5000 UNITS EX SOLR
CUTANEOUS | Status: AC
  Filled 2021-09-20: qty 10000

## 2021-09-20 MED ORDER — LIDOCAINE-EPINEPHRINE 0.5 %-1:200000 IJ SOLN
INTRAMUSCULAR | Status: AC
  Filled 2021-09-20: qty 1

## 2021-09-20 MED ORDER — 0.9 % SODIUM CHLORIDE (POUR BTL) OPTIME
TOPICAL | Status: DC | PRN
  Administered 2021-09-20: 1000 mL

## 2021-09-20 MED ORDER — BUPIVACAINE HCL (PF) 0.25 % IJ SOLN
INTRAMUSCULAR | Status: AC
  Filled 2021-09-20: qty 30

## 2021-09-20 MED ORDER — DEXAMETHASONE SODIUM PHOSPHATE 10 MG/ML IJ SOLN
INTRAMUSCULAR | Status: AC
  Filled 2021-09-20: qty 1

## 2021-09-20 MED ORDER — FENTANYL CITRATE (PF) 250 MCG/5ML IJ SOLN
INTRAMUSCULAR | Status: AC
  Filled 2021-09-20: qty 5

## 2021-09-20 MED ORDER — ONDANSETRON HCL 4 MG/2ML IJ SOLN
INTRAMUSCULAR | Status: DC | PRN
  Administered 2021-09-20: 4 mg via INTRAVENOUS

## 2021-09-20 MED ORDER — ROCURONIUM BROMIDE 10 MG/ML (PF) SYRINGE
PREFILLED_SYRINGE | INTRAVENOUS | Status: DC | PRN
  Administered 2021-09-20: 60 mg via INTRAVENOUS

## 2021-09-20 MED ORDER — PROPOFOL 10 MG/ML IV BOLUS
INTRAVENOUS | Status: AC
  Filled 2021-09-20: qty 20

## 2021-09-20 MED ORDER — LIDOCAINE 2% (20 MG/ML) 5 ML SYRINGE
INTRAMUSCULAR | Status: AC
  Filled 2021-09-20: qty 5

## 2021-09-20 MED ORDER — ROCURONIUM BROMIDE 10 MG/ML (PF) SYRINGE
PREFILLED_SYRINGE | INTRAVENOUS | Status: AC
  Filled 2021-09-20: qty 10

## 2021-09-20 MED ORDER — HYDROMORPHONE HCL 1 MG/ML IJ SOLN
INTRAMUSCULAR | Status: AC
  Filled 2021-09-20: qty 1

## 2021-09-20 MED ORDER — ONDANSETRON HCL 4 MG/2ML IJ SOLN
INTRAMUSCULAR | Status: AC
  Filled 2021-09-20: qty 2

## 2021-09-20 MED ORDER — HYDROMORPHONE HCL 1 MG/ML IJ SOLN
0.2500 mg | INTRAMUSCULAR | Status: DC | PRN
  Administered 2021-09-20 (×2): 0.25 mg via INTRAVENOUS

## 2021-09-20 MED ORDER — HEMOSTATIC AGENTS (NO CHARGE) OPTIME
TOPICAL | Status: DC | PRN
Start: 2021-09-20 — End: 2021-09-20
  Administered 2021-09-20: 1 via TOPICAL

## 2021-09-20 MED ORDER — PHENYLEPHRINE 40 MCG/ML (10ML) SYRINGE FOR IV PUSH (FOR BLOOD PRESSURE SUPPORT)
PREFILLED_SYRINGE | INTRAVENOUS | Status: DC | PRN
Start: 2021-09-20 — End: 2021-09-20
  Administered 2021-09-20: 40 ug via INTRAVENOUS
  Administered 2021-09-20 (×2): 120 ug via INTRAVENOUS

## 2021-09-20 MED ORDER — ORAL CARE MOUTH RINSE: 15.0000 mL | Freq: Once | OROMUCOSAL | Status: AC

## 2021-09-20 SURGICAL SUPPLY — 56 items
ADH SKN CLS APL DERMABOND .7 (GAUZE/BANDAGES/DRESSINGS) ×1
APL SKNCLS STERI-STRIP NONHPOA (GAUZE/BANDAGES/DRESSINGS)
BAG COUNTER SPONGE SURGICOUNT (BAG) ×2 IMPLANT
BAG SPNG CNTER NS LX DISP (BAG) ×1
BAND INSRT 18 STRL LF DISP RB (MISCELLANEOUS) ×2
BAND RUBBER #18 3X1/16 STRL (MISCELLANEOUS) ×4 IMPLANT
BENZOIN TINCTURE PRP APPL 2/3 (GAUZE/BANDAGES/DRESSINGS) IMPLANT
BLADE CLIPPER SURG (BLADE) IMPLANT
BUR MATCHSTICK NEURO 3.0 LAGG (BURR) ×2 IMPLANT
BUR PRECISION FLUTE 5.0 (BURR) IMPLANT
CANISTER SUCT 3000ML PPV (MISCELLANEOUS) ×2 IMPLANT
CARTRIDGE OIL MAESTRO DRILL (MISCELLANEOUS) ×1 IMPLANT
DECANTER SPIKE VIAL GLASS SM (MISCELLANEOUS) ×1 IMPLANT
DERMABOND ADVANCED (GAUZE/BANDAGES/DRESSINGS) ×1
DERMABOND ADVANCED .7 DNX12 (GAUZE/BANDAGES/DRESSINGS) ×1 IMPLANT
DIFFUSER DRILL AIR PNEUMATIC (MISCELLANEOUS) ×2 IMPLANT
DRAPE LAPAROTOMY 100X72X124 (DRAPES) ×2 IMPLANT
DRAPE MICROSCOPE LEICA (MISCELLANEOUS) ×2 IMPLANT
DRAPE SURG 17X23 STRL (DRAPES) ×2 IMPLANT
DURAPREP 26ML APPLICATOR (WOUND CARE) ×2 IMPLANT
ELECT REM PT RETURN 9FT ADLT (ELECTROSURGICAL) ×2
ELECTRODE REM PT RTRN 9FT ADLT (ELECTROSURGICAL) ×1 IMPLANT
GAUZE 4X4 16PLY ~~LOC~~+RFID DBL (SPONGE) ×1 IMPLANT
GAUZE SPONGE 4X4 12PLY STRL (GAUZE/BANDAGES/DRESSINGS) IMPLANT
GLOVE EXAM NITRILE XL STR (GLOVE) IMPLANT
GLOVE SURG LTX SZ6.5 (GLOVE) ×3 IMPLANT
GLOVE SURG POLYISO LF SZ6.5 (GLOVE) ×3 IMPLANT
GLOVE SURG PR MICRO ENCORE 7.5 (GLOVE) IMPLANT
GLOVE SURG UNDER POLY LF SZ6.5 (GLOVE) ×3 IMPLANT
GLOVE SURG UNDER POLY LF SZ7 (GLOVE) ×1 IMPLANT
GLOVE SURG UNDER POLY LF SZ8.5 (GLOVE) ×2 IMPLANT
GOWN STRL REUS W/ TWL LRG LVL3 (GOWN DISPOSABLE) ×2 IMPLANT
GOWN STRL REUS W/ TWL XL LVL3 (GOWN DISPOSABLE) IMPLANT
GOWN STRL REUS W/TWL 2XL LVL3 (GOWN DISPOSABLE) IMPLANT
GOWN STRL REUS W/TWL LRG LVL3 (GOWN DISPOSABLE) ×4
GOWN STRL REUS W/TWL XL LVL3 (GOWN DISPOSABLE) ×2
KIT BASIN OR (CUSTOM PROCEDURE TRAY) ×2 IMPLANT
KIT TURNOVER KIT B (KITS) ×2 IMPLANT
NDL HYPO 25X1 1.5 SAFETY (NEEDLE) ×1 IMPLANT
NDL SPNL 18GX3.5 QUINCKE PK (NEEDLE) IMPLANT
NEEDLE HYPO 25X1 1.5 SAFETY (NEEDLE) ×2 IMPLANT
NEEDLE SPNL 18GX3.5 QUINCKE PK (NEEDLE) IMPLANT
NS IRRIG 1000ML POUR BTL (IV SOLUTION) ×2 IMPLANT
OIL CARTRIDGE MAESTRO DRILL (MISCELLANEOUS) ×2
PACK LAMINECTOMY NEURO (CUSTOM PROCEDURE TRAY) ×2 IMPLANT
PAD ARMBOARD 7.5X6 YLW CONV (MISCELLANEOUS) ×9 IMPLANT
SPONGE SURGIFOAM ABS GEL SZ50 (HEMOSTASIS) ×2 IMPLANT
SPONGE T-LAP 4X18 ~~LOC~~+RFID (SPONGE) ×1 IMPLANT
STRIP CLOSURE SKIN 1/2X4 (GAUZE/BANDAGES/DRESSINGS) IMPLANT
SUT VIC AB 0 CT1 18XCR BRD8 (SUTURE) ×1 IMPLANT
SUT VIC AB 0 CT1 8-18 (SUTURE) ×2
SUT VIC AB 2-0 CT1 18 (SUTURE) ×2 IMPLANT
SUT VIC AB 3-0 SH 8-18 (SUTURE) ×3 IMPLANT
TOWEL GREEN STERILE (TOWEL DISPOSABLE) ×2 IMPLANT
TOWEL GREEN STERILE FF (TOWEL DISPOSABLE) ×2 IMPLANT
WATER STERILE IRR 1000ML POUR (IV SOLUTION) ×2 IMPLANT

## 2021-09-20 NOTE — Discharge Instructions (Addendum)
Lumbar Discectomy °Care After °A discectomy involves removal of discmaterial (the cartilage-like structures located between the bones of the back). It is done to relieve pressure on nerve roots. It can be used as a treatment for a back problem. The time in surgery depends on the findings in surgery and what is necessary to correct the problems. °HOME CARE INSTRUCTIONS  °· Check the cut (incision) made by the surgeon twice a day for signs of infection. Some signs of infection may include:  °· A foul smelling, greenish or yellowish discharge from the wound.  °· Increased pain.  °· Increased redness over the incision (operative) site.  °· The skin edges may separate.  °· Flu-like symptoms (problems).  °· A temperature above 101.5° F (38.6° C).  °· Change your bandages in about 24 to 36 hours following surgery or as directed.  °· You may shower tomrrow.  Avoid bathtubs, swimming pools and hot tubs for three weeks or until your incision has healed completely. °· Follow your doctor's instructions as to safe activities, exercises, and physical therapy.  °· Weight reduction may be beneficial if you are overweight.  °· Daily exercise is helpful to prevent the return of problems. Walking is permitted. You may use a treadmill without an incline. Cut down on activities and exercise if you have discomfort. You may also go up and down stairs as much as you can tolerate.  °· DO NOT lift anything heavier than 10 to 15 lbs. Avoid bending or twisting at the waist. Always bend your knees when lifting.  °· Maintain strength and range of motion as instructed.  °· Do not drive for 10 days, or as directed by your doctors. You may be a passenger . Lying back in the passenger seat may be more comfortable for you. Always wear a seatbelt.  °· Limit your sitting in a regular chair to 20 to 30 minutes at a time. There are no limitations for sitting in a recliner. You should lie down or walk in between sitting periods.  °· Only take  over-the-counter or prescription medicines for pain, discomfort, or fever as directed by your caregiver.  °SEEK MEDICAL CARE IF:  °· There is increased bleeding (more than a small spot) from the wound.  °· You notice redness, swelling, or increasing pain in the wound.  °· Pus is coming from wound.  °· You develop an unexplained oral temperature above 102° F (38.9° C) develops.  °· You notice a foul smell coming from the wound or dressing.  °· You have increasing pain in your wound.  °SEEK IMMEDIATE MEDICAL CARE IF:  °· You develop a rash.  °· You have difficulty breathing.  °· You develop any allergic problems to medicines given.  °Document Released: 06/06/2004 Document Revised: 06/21/2011 Document Reviewed: 09/25/2007 °ExitCare® Patient Information °
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
## 2021-09-20 NOTE — Transfer of Care (Signed)
Immediate Anesthesia Transfer of Care Note ? ?Patient: Frank Randolph ? ?Procedure(s) Performed: Redo Right Lumbar Four-Five discectomy (Bilateral: Spine Lumbar) ? ?Patient Location: PACU ? ?Anesthesia Type:General ? ?Level of Consciousness: drowsy ? ?Airway & Oxygen Therapy: Patient Spontanous Breathing ? ?Post-op Assessment: Report given to RN and Post -op Vital signs reviewed and stable ? ?Post vital signs: Reviewed and stable ? ?Last Vitals:  ?Vitals Value Taken Time  ?BP 127/81 09/20/21 1114  ?Temp    ?Pulse 96 09/20/21 1120  ?Resp 14 09/20/21 1120  ?SpO2 95 % 09/20/21 1120  ?Vitals shown include unvalidated device data. ? ?Last Pain:  ?Vitals:  ? 09/20/21 0721  ?TempSrc:   ?PainSc: 7   ?   ? ?Patients Stated Pain Goal: 3 (09/20/21 5465) ? ?Complications: No notable events documented. ?

## 2021-09-20 NOTE — Anesthesia Procedure Notes (Signed)
Procedure Name: Intubation ?Date/Time: 09/20/2021 9:23 AM ?Performed by: Imagene Riches, CRNA ?Pre-anesthesia Checklist: Patient identified, Emergency Drugs available, Suction available and Patient being monitored ?Patient Re-evaluated:Patient Re-evaluated prior to induction ?Oxygen Delivery Method: Circle System Utilized ?Preoxygenation: Pre-oxygenation with 100% oxygen ?Induction Type: IV induction ?Ventilation: Mask ventilation without difficulty ?Laryngoscope Size: Sabra Heck and 2 ?Grade View: Grade I ?Tube type: Oral ?Tube size: 7.5 mm ?Number of attempts: 1 ?Airway Equipment and Method: Stylet and Oral airway ?Placement Confirmation: ETT inserted through vocal cords under direct vision, positive ETCO2 and breath sounds checked- equal and bilateral ?Secured at: 22 cm ?Tube secured with: Tape ?Dental Injury: Teeth and Oropharynx as per pre-operative assessment  ? ? ? ? ?

## 2021-09-20 NOTE — Op Note (Signed)
09/20/2021 ? ?4:34 PM ? ?PATIENT:  Grayton Lobo  61 y.o. 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 ?POST-OPERATIVE DIAGNOSIS:  Lumbar disc displacement L4/5 ? ?PROCEDURE:  Procedure(s): ?Redo Right Lumbar Four-Five discectomy ? ?SURGEON:   Surgeon(s): ?Coletta Memos, MD ?Tressie Stalker, MD ? ?ASSISTANTS:Jenkins, jeffrey ? ?ANESTHESIA:   general ? ?EBL:  Total I/O ?In: 1300 [I.V.:1200; IV Piggyback:100] ?Out: 22 [Blood:75] ? ?BLOOD ADMINISTERED:none ? ?CELL SAVER GIVEN:not used ? ?COUNT:per nursing ? ?DRAINS: none  ? ?SPECIMEN:  No Specimen ? ?DICTATION: Mr. Sabol was taken to the operating room, intubated and placed under a general anesthetic without difficulty. He was positioned prone on a Wilson frame with all pressure points padded. His back was prepped and draped in a sterile manner. I opened the skin with a 10 blade and carried the dissection down to the thoracolumbar fascia. I used both sharp dissection and the monopolar cautery to expose the lamina of L4, and L5. I confirmed my location with an intraoperative xray.  ?I used the drill, Kerrison punches, and curettes to perform a semihemilaminectomy of L4. I used the punches to remove the ligamentum flavum to expose the thecal sac. I brought the microscope into the operative field and with Dr.Jenkin's assistance we started our decompression of the spinal canal, thecal sac and L5 root(s). I cauterized epidural veins overlying the disc space then divided them sharply. I opened the disc space with a 15 blade and proceeded with the discectomy. I used pituitary rongeurs, curettes, and other instruments to remove disc material. After the discectomy was completed we inspected the L5 nerve root and felt it was well decompressed. I explored rostrally, laterally, medially, and caudally and was satisfied with the decompression. I irrigated the wound, then  closed in layers. I approximated the thoracolumbar fascia, subcutaneous, and subcuticular planes with vicryl sutures. I used dermabond for a sterile dressing.  ? ?PLAN OF CARE: Discharge to home after PACU ? ?PATIENT DISPOSITION:  PACU - hemodynamically stable. ?  ?Delay start of Pharmacological VTE agent (>24hrs) due to surgical blood loss or risk of bleeding:  no ? ?  ?

## 2021-09-21 ENCOUNTER — Encounter (HOSPITAL_COMMUNITY): Payer: Self-pay | Admitting: Neurosurgery

## 2021-09-21 NOTE — Anesthesia Postprocedure Evaluation (Signed)
Anesthesia Post Note ? ?Patient: Frank Randolph ? ?Procedure(s) Performed: Redo Right Lumbar Four-Five discectomy (Bilateral: Spine Lumbar) ? ?  ? ?Patient location during evaluation: PACU ?Anesthesia Type: General ?Level of consciousness: sedated and patient cooperative ?Pain management: pain level controlled ?Vital Signs Assessment: post-procedure vital signs reviewed and stable ?Respiratory status: spontaneous breathing ?Cardiovascular status: stable ?Anesthetic complications: no ? ? ?No notable events documented. ? ?Last Vitals:  ?Vitals:  ? 09/20/21 1215 09/20/21 1230  ?BP: 128/89 (!) 126/95  ?Pulse: 84 82  ?Resp: 15 11  ?Temp:  36.7 ?C  ?SpO2: 94% 95%  ?  ?Last Pain:  ?Vitals:  ? 09/20/21 1230  ?TempSrc:   ?PainSc: 2   ? ? ?  ?  ?  ?  ?  ?  ? ?Nolon Nations ? ? ? ? ?

## 2021-11-15 ENCOUNTER — Other Ambulatory Visit: Payer: Self-pay | Admitting: Nurse Practitioner

## 2021-11-15 ENCOUNTER — Other Ambulatory Visit (HOSPITAL_COMMUNITY): Payer: Self-pay | Admitting: Nurse Practitioner

## 2021-11-15 DIAGNOSIS — K7402 Hepatic fibrosis, advanced fibrosis: Secondary | ICD-10-CM

## 2021-11-28 ENCOUNTER — Ambulatory Visit (HOSPITAL_COMMUNITY)
Admission: RE | Admit: 2021-11-28 | Discharge: 2021-11-28 | Disposition: A | Discharge status: Home / Self Care | Payer: Medicare Other | Source: Ambulatory Visit | Attending: Nurse Practitioner | Admitting: Nurse Practitioner

## 2021-11-28 DIAGNOSIS — K7402 Hepatic fibrosis, advanced fibrosis: Secondary | ICD-10-CM | POA: Diagnosis not present

## 2022-10-25 ENCOUNTER — Encounter: Payer: Self-pay | Admitting: Orthopaedic Surgery

## 2022-10-25 ENCOUNTER — Telehealth: Payer: Self-pay | Admitting: Radiology

## 2022-10-25 ENCOUNTER — Ambulatory Visit (INDEPENDENT_AMBULATORY_CARE_PROVIDER_SITE_OTHER): Payer: Medicare Other | Admitting: Orthopaedic Surgery

## 2022-10-25 VITALS — Ht 67.0 in | Wt 155.0 lb

## 2022-10-25 DIAGNOSIS — M65321 Trigger finger, right index finger: Secondary | ICD-10-CM | POA: Diagnosis not present

## 2022-10-25 MED ORDER — LIDOCAINE HCL 1 % IJ SOLN
0.5000 mL | INTRAMUSCULAR | Status: AC | PRN
  Administered 2022-10-25: .5 mL

## 2022-10-25 MED ORDER — METHYLPREDNISOLONE ACETATE 40 MG/ML IJ SUSP
20.0000 mg | INTRAMUSCULAR | Status: AC | PRN
  Administered 2022-10-25: 20 mg

## 2022-10-25 MED ORDER — BUPIVACAINE HCL 0.25 % IJ SOLN
0.5000 mL | INTRAMUSCULAR | Status: AC | PRN
  Administered 2022-10-25: .5 mL
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
## 2022-10-25 NOTE — Telephone Encounter (Signed)
error 

## 2022-12-29 IMAGING — US US ABDOMEN LIMITED
1 series · 14 of 25 positions shown · non-contrast
Comparison: Abdominal ultrasound 03/13/2021

CLINICAL DATA: Hepatic fibrosis, HCC screening

EXAM:
ULTRASOUND ABDOMEN LIMITED RIGHT UPPER QUADRANT

[Series 1: us abdomen limited ruq (liver/gb) · 14 of 77 slices shown]
[im 1/77]
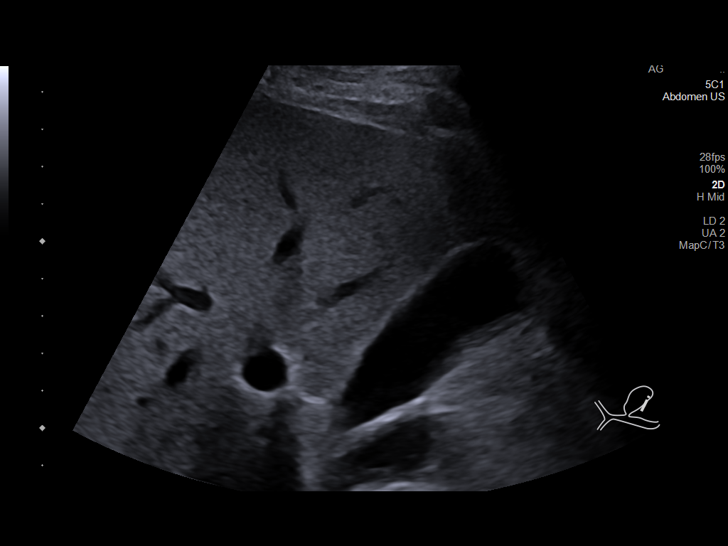
[im 7/77]
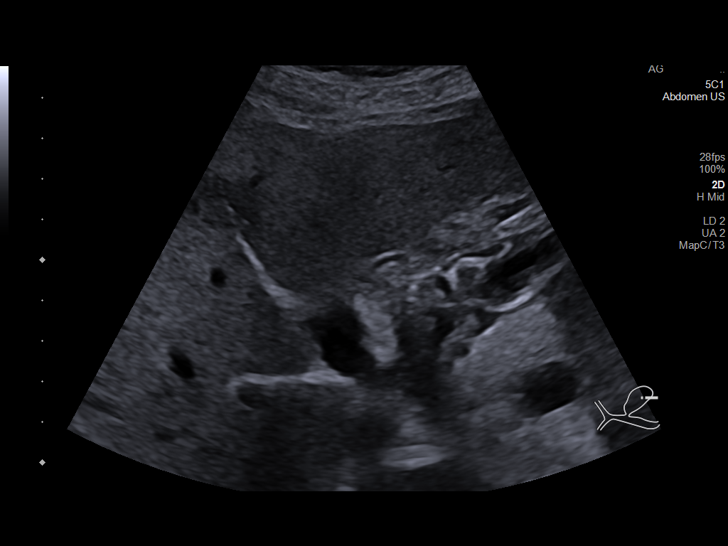
[im 13/77]
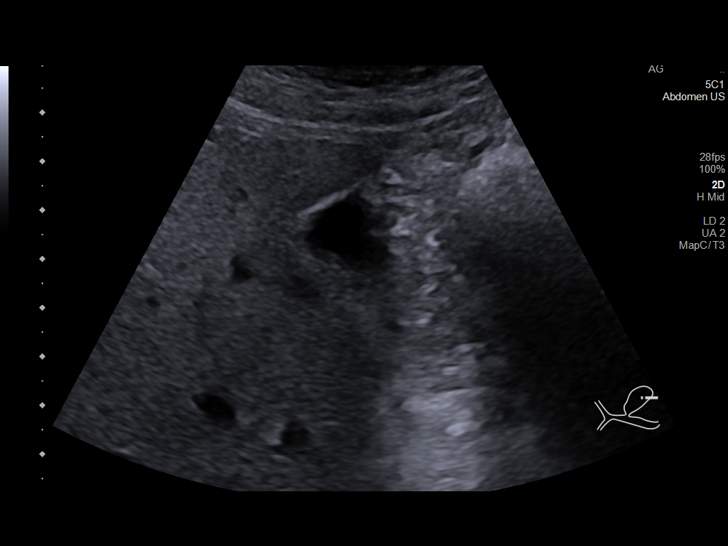
[im 20/77]
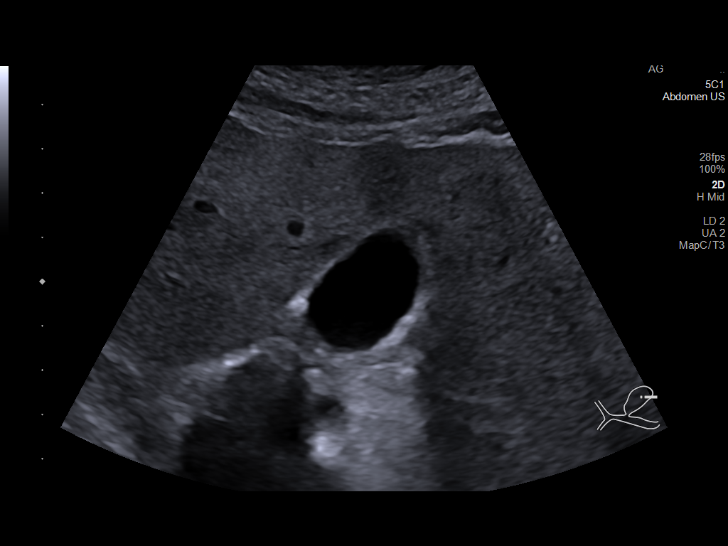
[im 26/77]
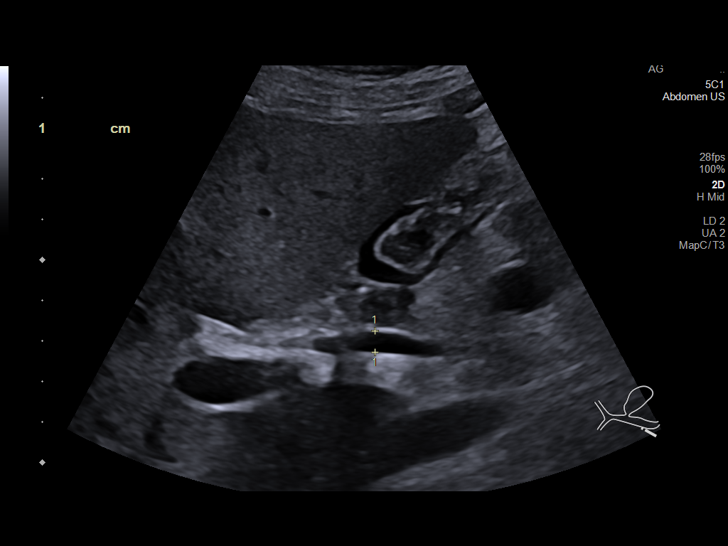
[im 29/77]
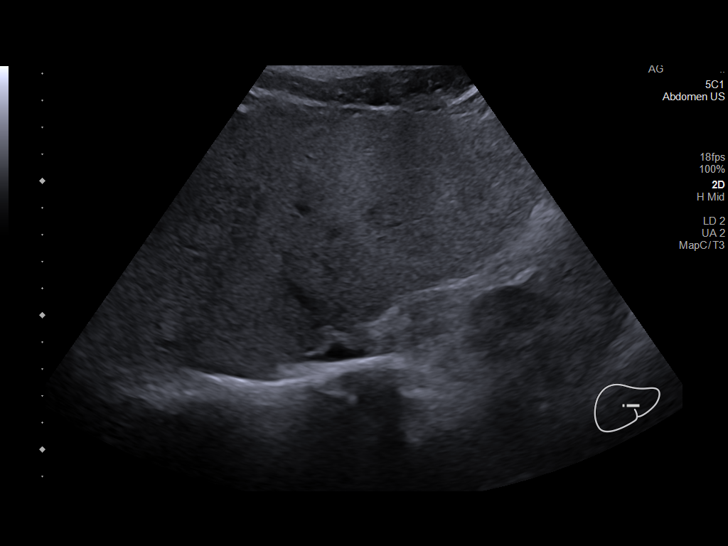
[im 35/77]
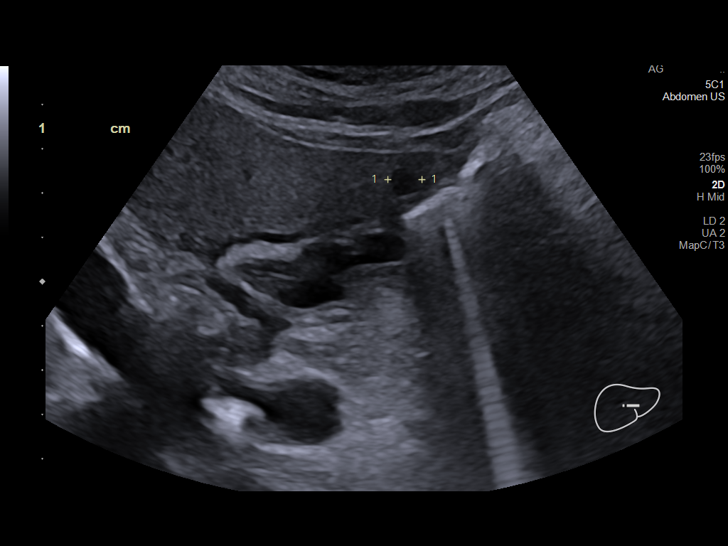
[im 42/77]
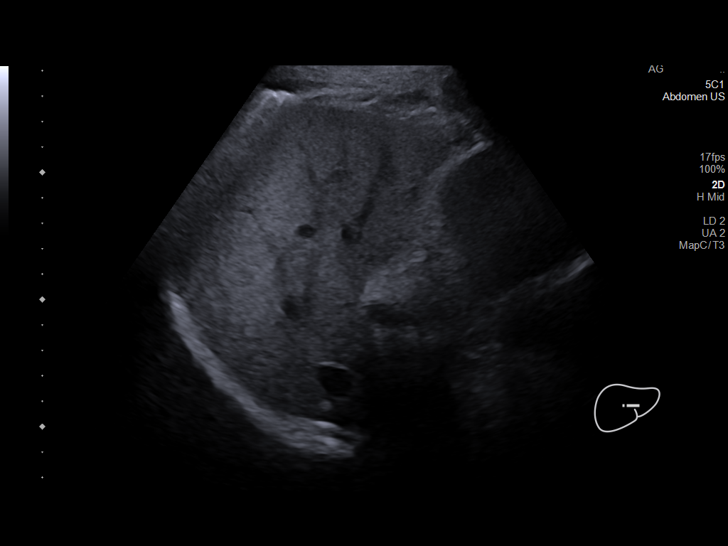
[im 48/77]
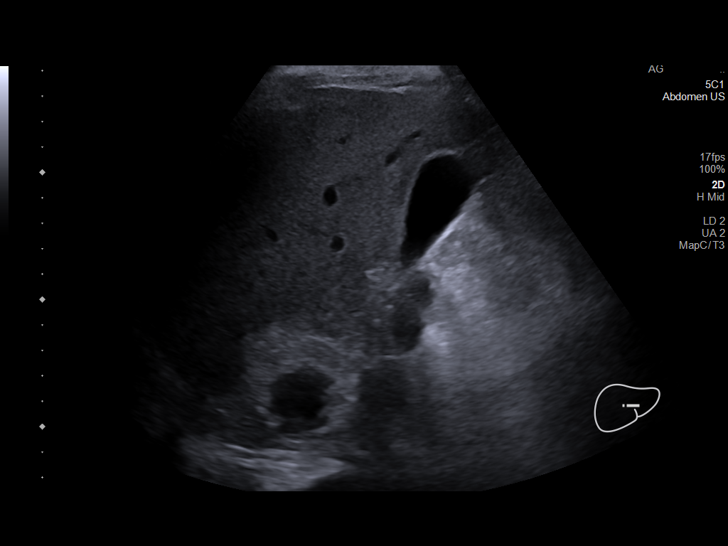
[im 51/77]
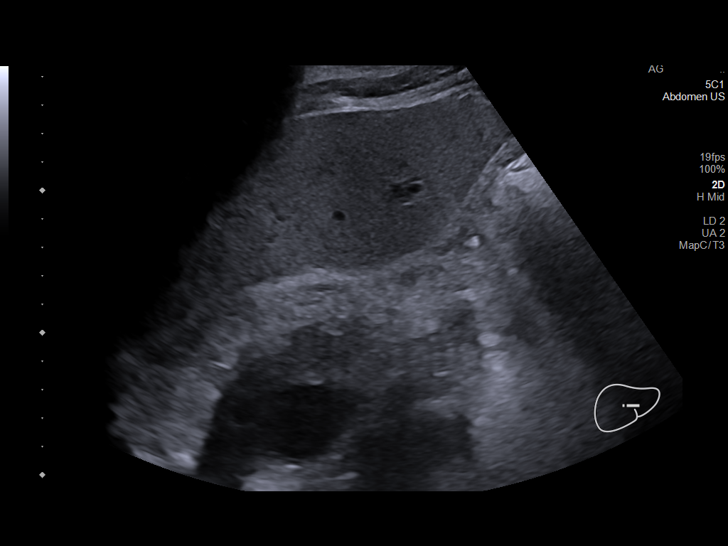
[im 58/77]
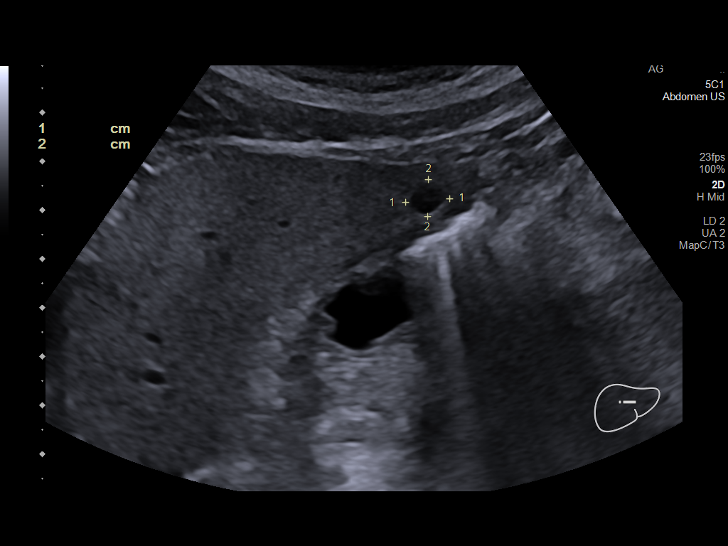
[im 64/77]
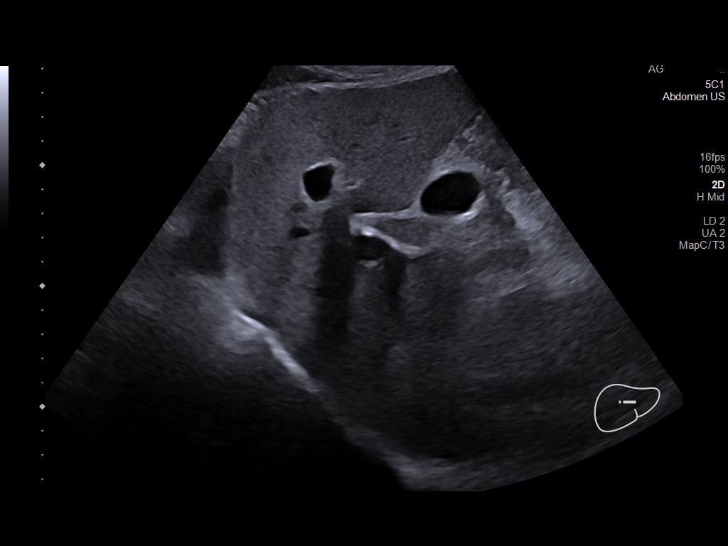
[im 70/77]
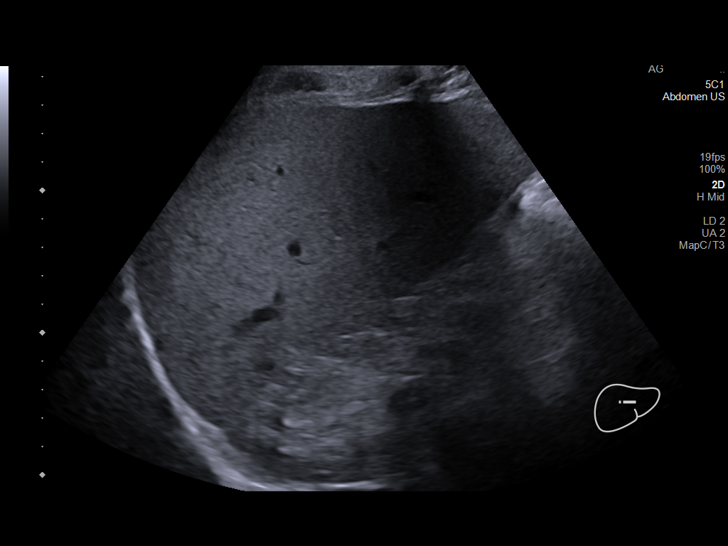
[im 77/77]
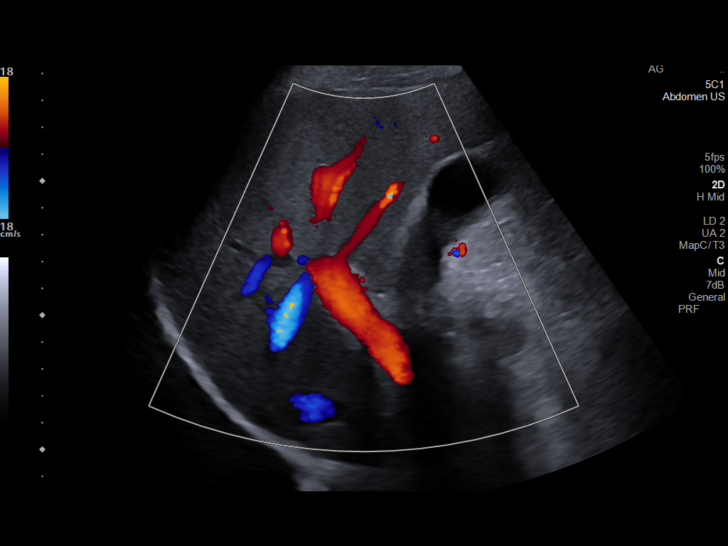

[14 of 25 positions shown; findings below may reference images not displayed]

FINDINGS: Gallbladder:

No gallstones or wall thickening visualized. No sonographic Murphy
sign noted by sonographer.

Common bile duct:

Diameter: 5 mm

Liver:

Inhomogeneous, increased echogenicity of the liver parenchyma with
no suspicious mass identified. There is a 9 mm anechoic cyst in the
left lobe. Portal vein is patent on color Doppler imaging with
normal direction of blood flow towards the liver.

Other: None.
IMPRESSION: 1. Abnormal echotexture of the liver parenchyma suggesting hepatic
steatosis and/or other hepatocellular disease.
2. Small left hepatic cyst.
